# Patient Record
Sex: Male | Born: 1955 | Race: White | Hispanic: No | Marital: Married | Smoking: Never smoker
Health system: Southern US, Community
[De-identification: ages and names within clinical notes are randomized; demographics above are authoritative.]

## PROBLEM LIST (undated history)

## (undated) DIAGNOSIS — R161 Splenomegaly, not elsewhere classified: Secondary | ICD-10-CM

## (undated) DIAGNOSIS — D229 Melanocytic nevi, unspecified: Secondary | ICD-10-CM

## (undated) DIAGNOSIS — K7689 Other specified diseases of liver: Secondary | ICD-10-CM

## (undated) DIAGNOSIS — N400 Enlarged prostate without lower urinary tract symptoms: Secondary | ICD-10-CM

## (undated) DIAGNOSIS — R200 Anesthesia of skin: Secondary | ICD-10-CM

## (undated) DIAGNOSIS — R7989 Other specified abnormal findings of blood chemistry: Secondary | ICD-10-CM

## (undated) DIAGNOSIS — R17 Unspecified jaundice: Secondary | ICD-10-CM

## (undated) DIAGNOSIS — H938X9 Other specified disorders of ear, unspecified ear: Secondary | ICD-10-CM

## (undated) DIAGNOSIS — N281 Cyst of kidney, acquired: Secondary | ICD-10-CM

## (undated) DIAGNOSIS — R202 Paresthesia of skin: Secondary | ICD-10-CM

## (undated) DIAGNOSIS — M549 Dorsalgia, unspecified: Secondary | ICD-10-CM

## (undated) HISTORY — DX: Melanocytic nevi, unspecified: D22.9

## (undated) HISTORY — DX: Cyst of kidney, acquired: N28.1

## (undated) HISTORY — DX: Other specified disorders of ear, unspecified ear: H93.8X9

## (undated) HISTORY — PX: BACK SURGERY: SHX140

## (undated) HISTORY — DX: Splenomegaly, not elsewhere classified: R16.1

## (undated) HISTORY — DX: Anesthesia of skin: R20.0

## (undated) HISTORY — DX: Other specified abnormal findings of blood chemistry: R79.89

## (undated) HISTORY — DX: Unspecified jaundice: R17

## (undated) HISTORY — DX: Benign prostatic hyperplasia without lower urinary tract symptoms: N40.0

## (undated) HISTORY — DX: Other specified diseases of liver: K76.89

## (undated) HISTORY — DX: Dorsalgia, unspecified: M54.9

## (undated) HISTORY — DX: Anesthesia of skin: R20.2

---

## 2003-09-04 ENCOUNTER — Ambulatory Visit (HOSPITAL_COMMUNITY): Admission: RE | Admit: 2003-09-04 | Discharge: 2003-09-04 | Payer: Self-pay | Admitting: Specialist

## 2007-02-20 DIAGNOSIS — M549 Dorsalgia, unspecified: Secondary | ICD-10-CM

## 2007-02-20 HISTORY — DX: Dorsalgia, unspecified: M54.9

## 2007-02-20 HISTORY — PX: OTHER SURGICAL HISTORY: SHX169

## 2007-08-06 ENCOUNTER — Observation Stay (HOSPITAL_COMMUNITY): Admission: RE | Admit: 2007-08-06 | Discharge: 2007-08-07 | Payer: Self-pay | Admitting: Specialist

## 2009-02-02 ENCOUNTER — Ambulatory Visit: Payer: Self-pay | Admitting: Family Medicine

## 2009-02-02 DIAGNOSIS — IMO0002 Reserved for concepts with insufficient information to code with codable children: Secondary | ICD-10-CM | POA: Insufficient documentation

## 2009-02-02 DIAGNOSIS — D239 Other benign neoplasm of skin, unspecified: Secondary | ICD-10-CM | POA: Insufficient documentation

## 2009-02-02 LAB — CONVERTED CEMR LAB
ALT: 17 units/L (ref 0–53)
AST: 22 units/L (ref 0–37)
Albumin: 4.1 g/dL (ref 3.5–5.2)
Alkaline Phosphatase: 102 units/L (ref 39–117)
BUN: 11 mg/dL (ref 6–23)
Bilirubin, Direct: 0 mg/dL (ref 0.0–0.3)
CO2: 26 meq/L (ref 19–32)
Calcium: 9.4 mg/dL (ref 8.4–10.5)
Chloride: 101 meq/L (ref 96–112)
Cholesterol: 167 mg/dL (ref 0–200)
Creatinine, Ser: 0.9 mg/dL (ref 0.4–1.5)
GFR calc non Af Amer: 93.89 mL/min (ref >60)
Glucose, Bld: 86 mg/dL (ref 70–99)
HDL: 32 mg/dL — ABNORMAL LOW (ref >39.00)
LDL Cholesterol: 102 mg/dL — ABNORMAL HIGH (ref 0–99)
PSA: 2.96 ng/mL (ref 0.10–4.00)
Potassium: 4.2 meq/L (ref 3.5–5.1)
Sodium: 138 meq/L (ref 135–145)
Total Bilirubin: 1.1 mg/dL (ref 0.3–1.2)
Total CHOL/HDL Ratio: 5
Total Protein: 7.1 g/dL (ref 6.0–8.3)
Triglycerides: 165 mg/dL — ABNORMAL HIGH (ref 0.0–149.0)
VLDL: 33 mg/dL (ref 0.0–40.0)

## 2009-02-14 ENCOUNTER — Encounter: Payer: Self-pay | Admitting: Family Medicine

## 2009-02-28 ENCOUNTER — Encounter: Payer: Self-pay | Admitting: Family Medicine

## 2009-03-15 ENCOUNTER — Encounter: Payer: Self-pay | Admitting: Family Medicine

## 2009-05-25 ENCOUNTER — Encounter: Payer: Self-pay | Admitting: Family Medicine

## 2010-03-21 NOTE — Letter (Signed)
Summary: [facility]  [facility]   Imported By: Maryln Gottron 06/16/2009 15:51:41  _____________________________________________________________________  External Attachment:    Type:   Image     Comment:   External Document

## 2010-03-21 NOTE — Consult Note (Signed)
Summary: [facility]  [facility]   Imported By: Lanelle Bal 03/07/2009 10:23:07  _____________________________________________________________________  External Attachment:    Type:   Image     Comment:   External Document

## 2010-03-21 NOTE — Procedures (Signed)
Summary: Colonoscopy/[facility]  Colonoscopy/[facility]   Imported By: Lanelle Bal 06/14/2009 10:52:25  _____________________________________________________________________  External Attachment:    Type:   Image     Comment:   External Document  Appended Document: [facility], repeat colonoscopy in 5 years.

## 2010-03-21 NOTE — Consult Note (Signed)
Summary: [facility]  [facility]   Imported By: Lanelle Bal 03/25/2009 10:19:23  _____________________________________________________________________  External Attachment:    Type:   Image     Comment:   External Document

## 2010-07-04 NOTE — Op Note (Signed)
[name], [name]             ACCOUNT NO.:  [id number]   MEDICAL RECORD NO.:  [id number]          PATIENT TYPE:  AMB   LOCATION:  DAY                          FACILITY:  [facility]   PHYSICIAN:  Ronnie [name], M.D.    DATE OF BIRTH:  [birth year]   DATE OF PROCEDURE:  08/06/2007  DATE OF DISCHARGE:                               OPERATIVE REPORT   PREOPERATIVE DIAGNOSIS:  Spinal stenosis, herniated nucleus pulposus L5-  S1, right.   POSTOPERATIVE DIAGNOSIS:  Spinal stenosis, herniated nucleus pulposus L5-  S1, right.   PROCEDURE:  1. Lateral recess decompression.  2. Microdiskectomy L5-S.  3. Right foraminotomy S1.   SURGEON:  Ronnie [name], M.D.   ASSISTANT:  Ronnie [name], P.A.   ANESTHESIA:  General.   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  Positive neurotension signs,  diminished sensation of the S1 dermatome.  MRI indicating disk  herniation compressing the S1 nerve root and lateral recessed stenosis,  indicated for decompression.  This has been discussed including  bleeding, infection, damage to vascular structures,  CSF leakage  __________and future anesthetic complications etc.   The patient was placed in the supine position after induction of  adequate general anesthesia and had 1 g of Kefzol.  She was placed prone  on the [place] frame.  All bony prominences were well padded.  The  lumbar region was prepped in the usual sterile fashion.  An 18 gauge  spinal needle was utilized to localize the L5-S1 interspace confirmed  with x-ray.  An incision was made from the spinous process of 5 to S1.  The subcutaneous tissue was dissected by electrocautery to achieve  hemostasis.  The dorsolumbar fascia was identified by a longitudinal  skin incision and the paraspinous muscle elevated from lamina of  5 to  1.  McCullough retractors were placed. The operating microscope was  draped and brought in the  surgical field after a Penfield 4 was placed  in the interlaminar space confirming the space with x-ray.  Hemilaminotomy at the caudad edge of 5-S1 with a 2-mm Kerrison removing  the ligamentum flavum attachment, detaching the cephalad edge of S1  utilizing a straight curette,  removed from the interspace.  Neural  elements well protected with a neural patty.  Severe lateral recess  stenosis was noted compressing the S1 nerve root into the lateral  recess.  I performed a decompression of the lateral recess to the medial  border of the pedicle utilizing a 2-mm Kerrison.  There was extensive  fibrotic tissue tethering the S1 nerve root laterally.  This was lysed  to mobilize the S1 nerve root.  This was then gently mobilized medially  protecting the thecal sac and the lateral at the edge of the nerve root.  There was focal HNP.  This was confirmed with an 18 gauge needle.  I  then incised the annulus with a 15 blade.  I performed diskectomy and  removed first copious portions of disk material with a straight upbiting  pituitary through the disk space.  I then used an Epstein to further  mobilize this caudad as the fragment was noted to migrate caudad from  the disk space.  Multiple fragments were retrieved with a nerve hook and  the pituitary.  Following the diskectomy of all herniated material,  there was good excursion of the S1 nerve release 1-cm medial to the  pedicle.  A foraminotomy of S1 was performed as well.  The hockey stick  was placed freely up the foramen of 5 and S1.  The disk space was  copiously irrigated with antibiotic irrigation.  Inspection revealed no  CSF leaks or active bleeding.  There was  no disk herniation of the  axilla root beneath the thecal sac, shoulder of the root, or of either  foramen.  We then placed thrombin-soaked Gelfoam into the laminar space  and removed the McCullough retractor.  The paraspinous muscle inspected  with no active bleeding. The  dorsolumbar fascia was reapproximated with  1Vicryl interrupted figure-of-eight suture.  The subcutaneous tissue was  reapproximated with 2-0 Vicryl subcuticular sutures.  The skin was  reapproximated with subcuticular Prolene and the wound was reinforced  with Steri-Strips.  Sterile dressing applied.  The patient was placed  supine on the hospital bed, extubated without difficulty and transported  to recovery room in satisfactory addition.  The patient tolerated the  procedure with no complications.      Ronnie [name], M.D.  Electronically Signed     JB/MEDQ  D:  08/06/2007  T:  08/06/2007  Job:  [id number]

## 2010-11-16 LAB — URINALYSIS, ROUTINE W REFLEX MICROSCOPIC
Bilirubin Urine: NEGATIVE
Glucose, UA: NEGATIVE
Hgb urine dipstick: NEGATIVE
Ketones, ur: NEGATIVE
Nitrite: NEGATIVE
Protein, ur: NEGATIVE
Specific Gravity, Urine: 1.019
Urobilinogen, UA: 1
pH: 7

## 2010-11-16 LAB — COMPREHENSIVE METABOLIC PANEL
ALT: 17
AST: 20
Albumin: 3.7
Alkaline Phosphatase: 103
BUN: 8
CO2: 25
Calcium: 9
Chloride: 107
Creatinine, Ser: 0.88
GFR calc Af Amer: >60
GFR calc non Af Amer: >60
Glucose, Bld: 98
Potassium: 3.8
Sodium: 139
Total Bilirubin: 1.5 — ABNORMAL HIGH
Total Protein: 6.3

## 2010-11-16 LAB — PROTIME-INR
INR: 1
Prothrombin Time: 13.4

## 2010-11-16 LAB — CBC
HCT: 45.1
Hemoglobin: 15.7
MCHC: 34.7
MCV: 85.4
Platelets: 215
RBC: 5.28
RDW: 13.4
WBC: 6.2

## 2010-11-16 LAB — APTT: aPTT: 29

## 2013-05-28 ENCOUNTER — Encounter: Payer: Self-pay | Admitting: Family Medicine

## 2013-05-28 ENCOUNTER — Ambulatory Visit (INDEPENDENT_AMBULATORY_CARE_PROVIDER_SITE_OTHER): Payer: Managed Care, Other (non HMO) | Admitting: Family Medicine

## 2013-05-28 VITALS — BP 110/72 | HR 98 | Temp 99.5°F | Wt 197.5 lb

## 2013-05-28 DIAGNOSIS — J069 Acute upper respiratory infection, unspecified: Secondary | ICD-10-CM | POA: Insufficient documentation

## 2013-05-28 DIAGNOSIS — B9789 Other viral agents as the cause of diseases classified elsewhere: Principal | ICD-10-CM

## 2013-05-28 NOTE — Progress Notes (Signed)
Pre visit review using our clinic review tool, if applicable. No additional management support is needed unless otherwise documented below in the visit note. 

## 2013-05-28 NOTE — Assessment & Plan Note (Signed)
Supportive care as per instructions. Update if sxs persist or fail to improve. Red flags to return discussed. Declines prescription cough syrup today.

## 2013-05-28 NOTE — Patient Instructions (Addendum)
Sounds like you have a viral respiratory infection. Antibiotics are not needed for this.  Viral infections usually take 7-10 days to resolve.  The cough can last a few weeks to go away. Ibuprofen 400-600mg  twice daily with food.  Start plain mucinex or immediate release guaifenesin with plenty of water to help mobilize mucous. Push fluids and plenty of rest. Let me know if fever >101 after first few days, or worsening productive cough despite above, or prolonged symptoms past 7/10 days. Call clinic with questions.  Good to see you today.

## 2013-05-28 NOTE — Progress Notes (Signed)
   BP 110/72  Pulse 98  Temp(Src) 99.5 F (37.5 C) (Oral)  Wt 197 lb 8 oz (89.585 kg)  SpO2 96%   CC: cough with chills  Subjective:    Patient ID: [name], male    DOB: 08/26/1955, 58 y.o.   MRN: [id number]  HPI: Ademide Schaberg Ganci is a 58 y.o. male presenting on 05/28/2013 for Chills, cough   Patient of Dr. Hulen Shouts not seen here since 2010 presents today with 2d h/o cough mildly productive of mucous, fever to 100.5, progressively getting worse.  Increasing fatigue recently.  Slept ok last night.  No head congestion, sinus pain, ST, nausea/vomiting, diarrhea, headache, ear or tooth pain, PNDrainage.  Hasn't tried anything for this so far.  No h/o asthma, allergies. No sick contacts at home. No smokers at home. Did not get flu shot this year.  Relevant past medical, surgical, family and social history reviewed and updated as indicated.  Allergies and medications reviewed and updated. No current outpatient prescriptions on file prior to visit.   No current facility-administered medications on file prior to visit.    Review of Systems Per HPI unless specifically indicated above    Objective:    BP 110/72  Pulse 98  Temp(Src) 99.5 F (37.5 C) (Oral)  Wt 197 lb 8 oz (89.585 kg)  SpO2 96%  Physical Exam  Nursing note and vitals reviewed. Constitutional: He appears well-developed and well-nourished. No distress.  HENT:  Head: Normocephalic and atraumatic.  Right Ear: Hearing, tympanic membrane, external ear and ear canal normal.  Left Ear: Hearing, tympanic membrane, external ear and ear canal normal.  Nose: Nose normal. No mucosal edema or rhinorrhea. Right sinus exhibits no maxillary sinus tenderness and no frontal sinus tenderness. Left sinus exhibits no maxillary sinus tenderness and no frontal sinus tenderness.  Mouth/Throat: Uvula is midline, oropharynx is clear and moist and mucous membranes are normal. No oropharyngeal exudate, posterior oropharyngeal  edema, posterior oropharyngeal erythema or tonsillar abscesses.  Eyes: Conjunctivae and EOM are normal. Pupils are equal, round, and reactive to light. No scleral icterus.  Neck: Normal range of motion. Neck supple. No thyromegaly present.  Cardiovascular: Normal rate, regular rhythm, normal heart sounds and intact distal pulses.   No murmur heard. Pulmonary/Chest: Effort normal and breath sounds normal. No respiratory distress. He has no wheezes. He has no rales.  Lymphadenopathy:    He has no cervical adenopathy.  Skin: Skin is warm and dry. No rash noted.       Assessment & Plan:   Problem List Items Addressed This Visit   Viral URI with cough - Primary     Supportive care as per instructions. Update if sxs persist or fail to improve. Red flags to return discussed. Declines prescription cough syrup today.        Follow up plan: Return if symptoms worsen or fail to improve.

## 2015-02-20 HISTORY — PX: COLONOSCOPY: SHX174

## 2015-10-18 ENCOUNTER — Ambulatory Visit (INDEPENDENT_AMBULATORY_CARE_PROVIDER_SITE_OTHER): Payer: 59 | Admitting: Family Medicine

## 2015-10-18 ENCOUNTER — Ambulatory Visit (INDEPENDENT_AMBULATORY_CARE_PROVIDER_SITE_OTHER)
Admission: RE | Admit: 2015-10-18 | Discharge: 2015-10-18 | Disposition: A | Discharge status: Home / Self Care | Payer: 59 | Source: Ambulatory Visit | Attending: Family Medicine | Admitting: Family Medicine

## 2015-10-18 ENCOUNTER — Encounter: Payer: Self-pay | Admitting: Family Medicine

## 2015-10-18 VITALS — BP 120/64 | HR 71 | Temp 98.7°F | Wt 197.0 lb

## 2015-10-18 DIAGNOSIS — R0789 Other chest pain: Secondary | ICD-10-CM

## 2015-10-18 MED ORDER — IBUPROFEN 200 MG PO TABS: 400.0000 mg | ORAL_TABLET | Freq: Three times a day (TID) | ORAL | Status: DC | PRN

## 2015-10-18 NOTE — Progress Notes (Signed)
Sx started about 1 month ago.  R sided chest pain.  Not sore to the touch.  Noted when laying in bed and moving around.  No L sided pain except for just L of the sternum, recently occ noted, but not as severe as the R side.  Gradually the painful area to R of sternum got larger.  He felt a tearing sensation locally on R side later on, not initially.   Sx also noted with twisting to the left side.    No trauma initially.  No bruising.  No meds or intervention tried.  Pensions consultant at [employer].  No heavy lifting.  No back pain.  No FCNAVD.  He doesn't fell unwell o/w.  Normal strength in the ext per patient report.   Meds, vitals, and allergies reviewed.   ROS: Per HPI unless specifically indicated in ROS section   GEN: nad, alert and oriented HEENT: mucous membranes moist, OP wnl NECK: supple w/o LA CV: rrr.  PULM: ctab, no inc wob, no UAN noise EXT: no edema SKIN: no acute rash R chest wall ttp just lateral to mid portion of the sternum, reproduced with deep breath. No bruising.  Is ttp locally.

## 2015-10-18 NOTE — Progress Notes (Signed)
Pre visit review using our clinic review tool, if applicable. No additional management support is needed unless otherwise documented below in the visit note. 

## 2015-10-18 NOTE — Patient Instructions (Signed)
Go to the lab on the way out.  We'll contact you with your xray report. Try taking 400mg  ibuprofen up to 3 times a day with food.  Update [place] if not better.  Take care.  Glad to see you.

## 2015-10-19 DIAGNOSIS — R0789 Other chest pain: Secondary | ICD-10-CM | POA: Insufficient documentation

## 2015-10-19 NOTE — Assessment & Plan Note (Signed)
He has reproducible symptoms. I'm not concerned for any intrathoracic issue to be causing his current symptoms. There's no sign of an ominous diagnosis. See after visit summary. We can check a chest x-ray in the meantime. See notes on that report. Follow-up when necessary. Routed to PCP as FYI.

## 2016-01-05 ENCOUNTER — Encounter: Payer: Self-pay | Admitting: Family Medicine

## 2016-05-31 ENCOUNTER — Ambulatory Visit (INDEPENDENT_AMBULATORY_CARE_PROVIDER_SITE_OTHER): Payer: 59 | Admitting: Family Medicine

## 2016-05-31 ENCOUNTER — Encounter: Payer: Self-pay | Admitting: Family Medicine

## 2016-05-31 VITALS — BP 120/62 | HR 68 | Temp 98.5°F | Ht 70.5 in | Wt 198.2 lb

## 2016-05-31 DIAGNOSIS — R350 Frequency of micturition: Secondary | ICD-10-CM

## 2016-05-31 DIAGNOSIS — N451 Epididymitis: Secondary | ICD-10-CM

## 2016-05-31 LAB — POC URINALSYSI DIPSTICK (AUTOMATED)
Bilirubin, UA: NEGATIVE
Blood, UA: NEGATIVE
Glucose, UA: NEGATIVE
Ketones, UA: NEGATIVE
Leukocytes, UA: NEGATIVE
Nitrite, UA: NEGATIVE
Protein, UA: NEGATIVE
Spec Grav, UA: >=1.03 — AB (ref 1.010–1.025)
Urobilinogen, UA: 0.2 E.U./dL
pH, UA: 6 (ref 5.0–8.0)

## 2016-05-31 MED ORDER — LEVOFLOXACIN 500 MG PO TABS: 500.0000 mg | ORAL_TABLET | Freq: Every day | ORAL | 0 refills | Status: DC

## 2016-05-31 NOTE — Addendum Note (Signed)
Addended by: Carter Kitten on: 05/31/2016 10:56 AM   Modules accepted: Orders

## 2016-05-31 NOTE — Progress Notes (Signed)
Dr. Frederico Hamman T. Gloristine Turrubiates, MD, [facility] [place], [address] Phone: [phone] Fax: [phone]  05/31/2016  Patient: Ronnie [name], MRN: [id number], DOB: December 05, 1955, 61 y.o.  Primary Physician:  Arnette Norris, MD   Chief Complaint  Patient presents with  . Urinary Frequency   Subjective:   Ronnie [name] is a 61 y.o. very pleasant male patient who presents with the following:  ? UTI. Had some swelling in the buttocks and  Nothing topically No swelling or tenderness.  Some pain in the back of the R testicle  Past Medical History, Surgical History, Social History, Family History, Problem List, Medications, and Allergies have been reviewed and updated if relevant.  Patient Active Problem List   Diagnosis Date Noted  . Chest wall pain 10/19/2015  . Viral URI with cough 05/28/2013  . NEVI, MULTIPLE 02/02/2009  . BACK PAIN WITH RADICULOPATHY 02/02/2009    Past Medical History:  Diagnosis Date  . Back pain 2009   with radiculopathy. Fell off roof bulging L4/L5 and a comp fx L5/S1  . Ear lump   . Numbness and tingling of foot    Right  . 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 Bulging disc L4/L5, comp fx L5/S1  2009   Post falling off roof    Social History   Social History  . Marital status: Married    Spouse name: N/A  . Number of children: 1  . Years of education: N/A   Occupational History  . Art gallery manager    Social History Main Topics  . Smoking status: Never Smoker  . Smokeless tobacco: Never Used  . Alcohol use No  . Drug use: No  . Sexual activity: Not on file   Other Topics Concern  . Not on file   Social History Narrative   No regular exercise.    Family History  Problem Relation Age of Onset  . Cancer Mother     Liver  . Heart disease Father     rheumatic heart disease, no CAD    No Known  Allergies  Medication list reviewed and updated in full in [place].  ROS: GEN: Acute illness details above GI: Tolerating PO intake GU: maintaining adequate hydration and urination Pulm: No SOB Interactive and getting along well at home.  Otherwise, ROS is as per the HPI.  Objective:   BP 120/62   Pulse 68   Temp 98.5 F (36.9 C) (Oral)   Ht 5' 10.5" (1.791 m)   Wt 198 lb 4 oz (89.9 kg)   BMI 28.04 kg/m    GEN: WDWN, NAD, Non-toxic, Alert & Oriented x 3 HEENT: Atraumatic, Normocephalic.  Ears and Nose: No external deformity. EXTR: No clubbing/cyanosis/edema NEURO: Normal gait.  PSYCH: Normally interactive. Conversant. Not depressed or anxious appearing.  Calm demeanor.   GU: mild tenderness on posterior of r testicle   Laboratory and Imaging Data: Results for orders placed or performed in visit on 05/31/16  POCT Urinalysis Dipstick (Automated)  Result Value Ref Range   Color, UA yellow    Clarity, UA clear    Glucose, UA negative    Bilirubin, UA negative    Ketones, UA negative    Spec Grav, UA >=1.030 (A) 1.010 - 1.025   Blood, UA negative    pH, UA 6.0 5.0 - 8.0  Protein, UA negative    Urobilinogen, UA 0.2 0.2 or 1.0 E.U./dL   Nitrite, UA negative    Leukocytes, UA Negative Negative     Assessment and Plan:   Epididymitis  Urinary frequency - Plan: POCT Urinalysis Dipstick (Automated)  Probable epididymitis - check culture also given sx  Follow-up: No Follow-up on file.  Meds ordered this encounter  Medications  . levofloxacin (LEVAQUIN) 500 MG tablet    Sig: Take 1 tablet (500 mg total) by mouth daily.    Dispense:  10 tablet    Refill:  0   Medications Discontinued During This Encounter  Medication Reason  . ibuprofen (MOTRIN IB) 200 MG tablet No longer needed (for PRN medications)   Orders Placed This Encounter  Procedures  . POCT Urinalysis Dipstick (Automated)    Signed,  Tekeshia Klahr T. Sakoya Win, MD   Allergies as of  05/31/2016   No Known Allergies     Medication List       Accurate as of 05/31/16 10:50 AM. Always use your most recent med list.          levofloxacin 500 MG tablet Commonly known as:  LEVAQUIN Take 1 tablet (500 mg total) by mouth daily.

## 2016-05-31 NOTE — Progress Notes (Signed)
Pre visit review using our clinic review tool, if applicable. No additional management support is needed unless otherwise documented below in the visit note. 

## 2016-06-02 LAB — URINE CULTURE: Organism ID, Bacteria: NO GROWTH

## 2016-06-10 ENCOUNTER — Emergency Department (HOSPITAL_COMMUNITY): Payer: 59

## 2016-06-10 ENCOUNTER — Encounter (HOSPITAL_COMMUNITY): Payer: Self-pay | Admitting: Emergency Medicine

## 2016-06-10 ENCOUNTER — Emergency Department (HOSPITAL_COMMUNITY)
Admission: EM | Admit: 2016-06-10 | Discharge: 2016-06-10 | Disposition: A | Discharge status: Home / Self Care | Payer: 59 | Attending: Emergency Medicine | Admitting: Emergency Medicine

## 2016-06-10 DIAGNOSIS — N202 Calculus of kidney with calculus of ureter: Secondary | ICD-10-CM | POA: Diagnosis not present

## 2016-06-10 DIAGNOSIS — Z79899 Other long term (current) drug therapy: Secondary | ICD-10-CM | POA: Insufficient documentation

## 2016-06-10 DIAGNOSIS — R109 Unspecified abdominal pain: Secondary | ICD-10-CM

## 2016-06-10 DIAGNOSIS — R1031 Right lower quadrant pain: Secondary | ICD-10-CM | POA: Diagnosis present

## 2016-06-10 DIAGNOSIS — N200 Calculus of kidney: Secondary | ICD-10-CM

## 2016-06-10 LAB — COMPREHENSIVE METABOLIC PANEL
ALT: 18 U/L (ref 17–63)
AST: 32 U/L (ref 15–41)
Albumin: 4.2 g/dL (ref 3.5–5.0)
Alkaline Phosphatase: 86 U/L (ref 38–126)
Anion gap: 13 (ref 5–15)
BUN: 15 mg/dL (ref 6–20)
CO2: 23 mmol/L (ref 22–32)
Calcium: 9.4 mg/dL (ref 8.9–10.3)
Chloride: 103 mmol/L (ref 101–111)
Creatinine, Ser: 1.23 mg/dL (ref 0.61–1.24)
GFR calc Af Amer: >60 mL/min (ref >60)
GFR calc non Af Amer: >60 mL/min (ref >60)
Glucose, Bld: 130 mg/dL — ABNORMAL HIGH (ref 65–99)
Potassium: 3.7 mmol/L (ref 3.5–5.1)
Sodium: 139 mmol/L (ref 135–145)
Total Bilirubin: 1.2 mg/dL (ref 0.3–1.2)
Total Protein: 6.4 g/dL — ABNORMAL LOW (ref 6.5–8.1)

## 2016-06-10 LAB — URINALYSIS, ROUTINE W REFLEX MICROSCOPIC
Bacteria, UA: NONE SEEN
Bilirubin Urine: NEGATIVE
Glucose, UA: NEGATIVE mg/dL
Ketones, ur: 5 mg/dL — AB
Leukocytes, UA: NEGATIVE
Nitrite: NEGATIVE
Protein, ur: 100 mg/dL — AB
Specific Gravity, Urine: 1.019 (ref 1.005–1.030)
pH: 7 (ref 5.0–8.0)

## 2016-06-10 LAB — LIPASE, BLOOD: Lipase: 18 U/L (ref 11–51)

## 2016-06-10 LAB — I-STAT CG4 LACTIC ACID, ED: Lactic Acid, Venous: 2.8 mmol/L (ref 0.5–1.9)

## 2016-06-10 LAB — CBC
HCT: 43.9 % (ref 39.0–52.0)
Hemoglobin: 14.7 g/dL (ref 13.0–17.0)
MCH: 28.5 pg (ref 26.0–34.0)
MCHC: 33.5 g/dL (ref 30.0–36.0)
MCV: 85.2 fL (ref 78.0–100.0)
Platelets: 227 10*3/uL (ref 150–400)
RBC: 5.15 MIL/uL (ref 4.22–5.81)
RDW: 13.3 % (ref 11.5–15.5)
WBC: 7.4 10*3/uL (ref 4.0–10.5)

## 2016-06-10 MED ORDER — HYDROCODONE-ACETAMINOPHEN 5-325 MG PO TABS: 1.0000 | ORAL_TABLET | ORAL | 0 refills | Status: DC | PRN

## 2016-06-10 MED ORDER — ONDANSETRON HCL 4 MG/2ML IJ SOLN
4.0000 mg | Freq: Once | INTRAMUSCULAR | Status: AC
Start: 2016-06-10 — End: 2016-06-10
  Administered 2016-06-10: 4 mg via INTRAVENOUS
  Filled 2016-06-10: qty 2

## 2016-06-10 MED ORDER — SODIUM CHLORIDE 0.9 % IV BOLUS (SEPSIS)
1000.0000 mL | Freq: Once | INTRAVENOUS | Status: AC
  Administered 2016-06-10: 1000 mL via INTRAVENOUS

## 2016-06-10 MED ORDER — HYDROMORPHONE HCL 1 MG/ML IJ SOLN
1.0000 mg | Freq: Once | INTRAMUSCULAR | Status: AC
  Administered 2016-06-10: 1 mg via INTRAVENOUS
  Filled 2016-06-10: qty 1

## 2016-06-10 NOTE — Discharge Instructions (Signed)
Follow-up with urology if you do not pass her stone in the next 24 hours. Return to the ER if you develop uncontrolled pain, fevers or worsening or new symptoms. For severe pain take norco or vicodin however realize they have the potential for addiction and it can make you sleepy and has tylenol in it.  No operating machinery while taking. If you were given medicines take as directed.  If you are on coumadin or contraceptives realize their levels and effectiveness is altered by many different medicines.  If you have any reaction (rash, tongues swelling, other) to the medicines stop taking and see a physician.    If your blood pressure was elevated in the ER make sure you follow up for management with a primary doctor or return for chest pain, shortness of breath or stroke symptoms.  Please follow up as directed and return to the ER or see a physician for new or worsening symptoms.  Thank you. Vitals:   06/10/16 2149 06/10/16 2200 06/10/16 2230 06/10/16 2245  BP: (!) 150/84 (!) 143/72 (!) 146/77 131/79  Pulse: 62 61 63 65  Resp: 18 (!) 26    Temp: 98.7 F (37.1 C)     TempSrc: Oral     SpO2: 99% 99% 96% 96%  Weight:      Height:

## 2016-06-10 NOTE — ED Triage Notes (Addendum)
Pt. reports worsening low abdominal pain radiating to right flank onset yesterday , mild nausea , no emesis or diarrhea. Pt. denies hematuria or dysuria . No fever or chills . Pt. added bladder pressure and urinary frequency with scant urine output.

## 2016-06-10 NOTE — ED Provider Notes (Signed)
[facility] DEPT Provider Note   CSN: [id number] Arrival date & time: 06/10/16  2140     History   Chief Complaint Chief Complaint  Patient presents with  . Abdominal Pain  . Flank Pain    HPI Ronnie [name] is a 61 y.o. male.  The history is provided by the patient.  Abdominal Pain   This is a new problem. The current episode started more than 2 days ago. The problem occurs constantly. The problem has been gradually worsening. Associated with: No recent sick contacts or recent travel. The pain is located in the RLQ. The quality of the pain is aching, sharp and shooting. The pain is at a severity of 10/10. The pain is severe. Associated symptoms include anorexia, nausea and frequency. Pertinent negatives include fever, belching, diarrhea, flatus, hematochezia, melena, vomiting, constipation, dysuria, hematuria, headaches, arthralgias and myalgias. The symptoms are aggravated by urination. Nothing relieves the symptoms. Past workup does not include surgery. His past medical history does not include PUD.    Past Medical History:  Diagnosis Date  . Back pain 2009   with radiculopathy. Fell off roof bulging L4/L5 and a comp fx L5/S1  . Ear lump   . Numbness and tingling of foot    Right  . Numerous moles    Has always had multiple nevi    Patient Active Problem List   Diagnosis Date Noted  . Chest wall pain 10/19/2015  . NEVI, MULTIPLE 02/02/2009  . BACK PAIN WITH RADICULOPATHY 02/02/2009    Past Surgical History:  Procedure Laterality Date  . BACK SURGERY    . Bulging disc L4/L5, comp fx L5/S1  2009   Post falling off roof       Home Medications    Prior to Admission medications   Medication Sig Start Date End Date Taking? Authorizing Provider  HYDROcodone-acetaminophen (NORCO) 5-325 MG tablet Take 1-2 tablets by mouth every 4 (four) hours as needed. 06/10/16   Elnora Morrison, MD  levofloxacin (LEVAQUIN) 500 MG tablet Take 1 tablet (500 mg total) by mouth  daily. 05/31/16   Owens Loffler, MD    Family History Family History  Problem Relation Age of Onset  . Cancer Mother     Liver  . Heart disease Father     rheumatic heart disease, no CAD    Social History Social History  Substance Use Topics  . Smoking status: Never Smoker  . Smokeless tobacco: Never Used  . Alcohol use No     Allergies   Patient has no known allergies.   Review of Systems Review of Systems  Constitutional: Negative for chills and fever.  HENT: Negative for ear pain and sore throat.   Eyes: Negative for pain and visual disturbance.  Respiratory: Negative for cough and shortness of breath.   Cardiovascular: Negative for chest pain and palpitations.  Gastrointestinal: Positive for abdominal pain, anorexia and nausea. Negative for constipation, diarrhea, flatus, hematochezia, melena and vomiting.  Genitourinary: Positive for difficulty urinating and frequency. Negative for dysuria and hematuria.  Musculoskeletal: Negative for arthralgias, back pain and myalgias.  Skin: Negative for color change and rash.  Neurological: Negative for seizures, syncope and headaches.  All other systems reviewed and are negative.    Physical Exam Updated Vital Signs BP 119/68   Pulse 87   Temp 98.7 F (37.1 C) (Oral)   Resp (!) 26   Ht 5' 10.5" (1.791 m)   Wt 87.5 kg   SpO2 92%   BMI 27.30  kg/m   Physical Exam  Constitutional: He appears well-developed and well-nourished. He appears distressed.  HENT:  Head: Normocephalic and atraumatic.  Eyes: Conjunctivae and EOM are normal.  Neck: Neck supple.  Cardiovascular: Normal rate and regular rhythm.   No murmur heard. Pulmonary/Chest: Effort normal and breath sounds normal. No respiratory distress.  Abdominal: Soft. There is tenderness in the right lower quadrant. There is CVA tenderness (right sided). There is no tenderness at McBurney's point.  Musculoskeletal: He exhibits no edema.  Neurological: He is alert.    Skin: Skin is warm and dry.  Psychiatric: He has a normal mood and affect.  Nursing note and vitals reviewed.    ED Treatments / Results  Labs (all labs ordered are listed, but only abnormal results are displayed) Labs Reviewed  COMPREHENSIVE METABOLIC PANEL - Abnormal; Notable for the following:       Result Value   Glucose, Bld 130 (*)    Total Protein 6.4 (*)    All other components within normal limits  URINALYSIS, ROUTINE W REFLEX MICROSCOPIC - Abnormal; Notable for the following:    APPearance HAZY (*)    Hgb urine dipstick SMALL (*)    Ketones, ur 5 (*)    Protein, ur 100 (*)    Squamous Epithelial / LPF 0-5 (*)    All other components within normal limits  I-STAT CG4 LACTIC ACID, ED - Abnormal; Notable for the following:    Lactic Acid, Venous 2.80 (*)    All other components within normal limits  LIPASE, BLOOD  CBC    EKG  EKG Interpretation None       Radiology Ct Renal Stone Study  Result Date: 06/10/2016 CLINICAL DATA:  Lower abdominal pain radiating to the right flank onset yesterday. EXAM: CT ABDOMEN AND PELVIS WITHOUT CONTRAST TECHNIQUE: Multidetector CT imaging of the abdomen and pelvis was performed following the standard protocol without IV contrast. COMPARISON:  None. FINDINGS: Lower chest: No acute abnormality. Minimal dependent atelectasis bilaterally. Hepatobiliary: No focal liver abnormality is seen. No gallstones, gallbladder wall thickening, or biliary dilatation. Pancreas: Unremarkable. No pancreatic ductal dilatation or surrounding inflammatory changes. Spleen: Normal in size without focal abnormality. Adrenals/Urinary Tract: 5 mm calculus is noted almost having passed through the interstitial bladder portion of the right ureter. This is causing moderate right-sided hydroureteronephrosis. The left kidney and ureter is unremarkable. The bladder is nondistended. Stomach/Bowel: Moderate fluid-filled distention of the stomach. Normal small bowel  rotation without obstruction or inflammation. Diffuse colonic spasm noted with scattered areas of colonic diverticulosis. No acute diverticulitis. No bowel obstruction. Vascular/Lymphatic: Aortic atherosclerosis. No enlarged abdominal or pelvic lymph nodes. Reproductive: Prostate is mildly enlarged up to 5.7 cm with central zone calcifications. Unremarkable seminal vesicles. Other: Small fat containing umbilical hernia. No abdominopelvic ascites. Musculoskeletal: Degenerative disc disease L4-5 and L5-S1. No acute nor suspicious osseous abnormalities. IMPRESSION: 1. Moderate right-sided hydroureteronephrosis secondary to a 5 mm distal right ureteral calculus which has almost passed completely into the bladder. 2. Mild prostatic enlargement up to 5.7 cm. Electronically Signed   By: Ashley Royalty M.D.   On: 06/10/2016 22:26    Procedures Procedures (including critical care time)  Medications Ordered in ED Medications  sodium chloride 0.9 % bolus 1,000 mL (0 mLs Intravenous Stopped 06/10/16 2252)  ondansetron (ZOFRAN) injection 4 mg (4 mg Intravenous Given 06/10/16 2211)  HYDROmorphone (DILAUDID) injection 1 mg (1 mg Intravenous Given 06/10/16 2211)     Initial Impression / Assessment and Plan / ED Course  I have reviewed the triage vital signs and the nursing notes.  Pertinent labs & imaging results that were available during my care of the patient were reviewed by me and considered in my medical decision making (see chart for details).     61 year old male with no pertinent past medical history presents in setting of right lower quadrant abdominal pain, right flank pain, decreased urination. Patient reports symptoms of been intermittent over last several days but significantly worsened today. On arrival patient distressed with right flank pain without significant tenderness to palpation. No abnormalities and generally ureter exam. CT scan revealed 5 mm kidney stone. Patient without significant  elevation in creatinine nor signs of urinary tract infection. Patient given IV fluids and pain medication on reassessment had almost complete resolution of symptoms.  As stone is at the UVJ without significant hydronephrosis or signs of kidney dysfunction believe patient is safe for discharge home with oral pain medication, planned increased hydration and follow-up with urology for further management of this condition. Additionally no signs of urinary tract infection noted. Patient was stable at time of discharge and strict return precautions given. Patient in agreement with plan.  Final Clinical Impressions(s) / ED Diagnoses   Final diagnoses:  Right flank discomfort  Nephrolithiasis    New Prescriptions Discharge Medication List as of 06/10/2016 11:20 PM    START taking these medications   Details  HYDROcodone-acetaminophen (NORCO) 5-325 MG tablet Take 1-2 tablets by mouth every 4 (four) hours as needed., Starting Sun 06/10/2016, Print         Esaw Grandchild, MD 06/10/16 [place], MD 06/10/16 2354

## 2016-06-10 NOTE — ED Notes (Signed)
Patient transported to CT SCAN . 

## 2016-06-10 NOTE — ED Notes (Signed)
Pt and family understood dc material. NAD noted. Script given at dc 

## 2018-06-25 ENCOUNTER — Ambulatory Visit (INDEPENDENT_AMBULATORY_CARE_PROVIDER_SITE_OTHER): Payer: 59 | Admitting: Internal Medicine

## 2018-06-25 ENCOUNTER — Encounter: Payer: Self-pay | Admitting: Internal Medicine

## 2018-06-25 VITALS — BP 120/70

## 2018-06-25 DIAGNOSIS — H9313 Tinnitus, bilateral: Secondary | ICD-10-CM

## 2018-06-25 DIAGNOSIS — H9319 Tinnitus, unspecified ear: Secondary | ICD-10-CM | POA: Insufficient documentation

## 2018-06-25 DIAGNOSIS — S139XXA Sprain of joints and ligaments of unspecified parts of neck, initial encounter: Secondary | ICD-10-CM
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
## 2018-06-25 NOTE — Assessment & Plan Note (Signed)
Mild and recent I discussed that I don't think this is related to his neck If persists, or clear hearing changes, recommended he call [facility] for a check up
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
## 2018-09-01 ENCOUNTER — Ambulatory Visit (INDEPENDENT_AMBULATORY_CARE_PROVIDER_SITE_OTHER): Payer: PRIVATE HEALTH INSURANCE | Admitting: Internal Medicine

## 2018-09-01 ENCOUNTER — Encounter: Payer: Self-pay | Admitting: Internal Medicine

## 2018-09-01 ENCOUNTER — Other Ambulatory Visit: Payer: Self-pay

## 2018-09-01 VITALS — BP 110/70 | HR 64 | Temp 98.1°F | Ht 70.5 in | Wt 197.0 lb

## 2018-09-01 DIAGNOSIS — Z125 Encounter for screening for malignant neoplasm of prostate: Secondary | ICD-10-CM | POA: Diagnosis not present

## 2018-09-01 DIAGNOSIS — Z0000 Encounter for general adult medical examination without abnormal findings: Secondary | ICD-10-CM | POA: Diagnosis not present

## 2018-09-01 DIAGNOSIS — Z23 Encounter for immunization: Secondary | ICD-10-CM

## 2018-09-01 DIAGNOSIS — R200 Anesthesia of skin: Secondary | ICD-10-CM | POA: Insufficient documentation

## 2018-09-01 LAB — CBC
HCT: 45.1 % (ref 39.0–52.0)
Hemoglobin: 15.1 g/dL (ref 13.0–17.0)
MCHC: 33.5 g/dL (ref 30.0–36.0)
MCV: 89 fl (ref 78.0–100.0)
Platelets: 257 10*3/uL (ref 150.0–400.0)
RBC: 5.07 Mil/uL (ref 4.22–5.81)
RDW: 13.6 % (ref 11.5–15.5)
WBC: 7.4 10*3/uL (ref 4.0–10.5)

## 2018-09-01 LAB — COMPREHENSIVE METABOLIC PANEL
ALT: 13 U/L (ref 0–53)
AST: 16 U/L (ref 0–37)
Albumin: 4.2 g/dL (ref 3.5–5.2)
Alkaline Phosphatase: 100 U/L (ref 39–117)
BUN: 10 mg/dL (ref 6–23)
CO2: 28 mEq/L (ref 19–32)
Calcium: 8.7 mg/dL (ref 8.4–10.5)
Chloride: 104 mEq/L (ref 96–112)
Creatinine, Ser: 0.95 mg/dL (ref 0.40–1.50)
GFR: 80.23 mL/min (ref >60.00)
Glucose, Bld: 90 mg/dL (ref 70–99)
Potassium: 4.3 mEq/L (ref 3.5–5.1)
Sodium: 138 mEq/L (ref 135–145)
Total Bilirubin: 1.3 mg/dL — ABNORMAL HIGH (ref 0.2–1.2)
Total Protein: 6.4 g/dL (ref 6.0–8.3)

## 2018-09-01 LAB — LIPID PANEL
Cholesterol: 179 mg/dL (ref 0–200)
HDL: 35 mg/dL — ABNORMAL LOW (ref >39.00)
LDL Cholesterol: 116 mg/dL — ABNORMAL HIGH (ref 0–99)
NonHDL: 143.56
Total CHOL/HDL Ratio: 5
Triglycerides: 140 mg/dL (ref 0.0–149.0)
VLDL: 28 mg/dL (ref 0.0–40.0)

## 2018-09-01 LAB — PSA: PSA: 6.83 ng/mL — ABNORMAL HIGH (ref 0.10–4.00)

## 2018-09-01 LAB — VITAMIN B12: Vitamin B-12: 350 pg/mL (ref 211–911)

## 2018-09-01 LAB — T4, FREE: Free T4: 0.89 ng/dL (ref 0.60–1.60)

## 2018-09-01 NOTE — Assessment & Plan Note (Signed)
Mild in hands Doesn't sound like CTS--but possible Check labs If progresses, will go ahead with neurology evaluation

## 2018-09-01 NOTE — Progress Notes (Signed)
Subjective:    Patient ID: Ronnie [name], male    DOB: 07/16/1955, 63 y.o.   MRN: [id number]  HPI Here for physical  Still gets some tinnitus Overall, is better than before  Has some hand numbness at night---tingling Doesn't awaken him Resolves within a short time in the morning No daytime symptoms No hand weakness  General aches and pains Stays active but no set exercise Various projects keeps him busy in retirement  No current outpatient medications on file prior to visit.   No current facility-administered medications on file prior to visit.     No Known Allergies  Past Medical History:  Diagnosis Date  . Back pain 2009   with radiculopathy. Fell off roof bulging L4/L5 and a comp fx L5/S1  . Ear lump   . Numbness and tingling of foot    Right  . 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 BACK SURGERY    . Bulging disc L4/L5, comp fx L5/S1  2009   Post falling off roof    Family History  Problem Relation Age of Onset  . Cancer Mother        Liver  . Heart disease Father        rheumatic heart disease, no CAD  . Heart disease Paternal Uncle   . Heart disease Paternal Grandfather     Social History   Socioeconomic History  . Marital status: Married    Spouse name: Not on file  . Number of children: 2  . Years of education: Not on file  . Highest education level: Not on file  Occupational History  . 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 Financial resource strain: Not on file  . Food insecurity    Worry: Not on file    Inability: Not on file  . Transportation needs    Medical: Not on file    Non-medical: Not on file  Tobacco Use  . Smoking status: Never Smoker  . Smokeless tobacco: Never Used  Substance and Sexual Activity  . Alcohol use: Yes    Comment: rare beer  . Drug use: No  . Sexual activity: Not on file  Lifestyle  . Physical  activity    Days per week: Not on file    Minutes per session: Not on file  . Stress: Not on file  Relationships  . Social Herbalist on phone: Not on file    Gets together: Not on file    Attends religious service: Not on file    Active member of club or organization: Not on file    Attends meetings of clubs or organizations: Not on file    Relationship status: Not on file  . Intimate partner violence    Fear of current or ex partner: Not on file    Emotionally abused: Not on file    Physically abused: Not on file    Forced sexual activity: Not on file  Other Topics Concern  . Not on file  Social History Narrative   No regular exercise.   Review of Systems  Constitutional: Negative for fatigue and unexpected weight change.       Wears seat belt  HENT: Positive for tinnitus. Negative for dental problem and hearing loss.        Keeps up with dentist  Eyes: Negative for visual  disturbance.       No diplopia or unilateral vision loss  Respiratory: Negative for cough, chest tightness and shortness of breath.   Cardiovascular: Negative for chest pain and leg swelling.       Occ skip beats  Gastrointestinal: Negative for abdominal pain, blood in stool and constipation.       Rare heartburn  Endocrine: Negative for polydipsia and polyuria.  Genitourinary: Negative for urgency.       Weak stream but not bothersome No sexual problems  Musculoskeletal: Positive for myalgias. Negative for arthralgias, back pain and joint swelling.  Skin: Negative for rash.       Slight change in "mole" in right antecubital fossa  Allergic/Immunologic: Negative for environmental allergies and immunocompromised state.  Neurological: Negative for dizziness, syncope, light-headedness and headaches.  Hematological: Negative for adenopathy. Bruises/bleeds easily.  Psychiatric/Behavioral: Negative for dysphoric mood and sleep disturbance. The patient is not nervous/anxious.       Objective:    Physical Exam  Constitutional: He is oriented to person, place, and time. He appears well-developed. No distress.  HENT:  Head: Normocephalic and atraumatic.  Right Ear: External ear normal.  Left Ear: External ear normal.  Mouth/Throat: Oropharynx is clear and moist. No oropharyngeal exudate.  Eyes: Pupils are equal, round, and reactive to light. Conjunctivae are normal.  Neck: No thyromegaly present.  Cardiovascular: Normal rate, regular rhythm, normal heart sounds and intact distal pulses. Exam reveals no gallop.  No murmur heard. Respiratory: Effort normal and breath sounds normal. No respiratory distress. He has no wheezes. He has no rales.  GI: Soft. There is no abdominal tenderness.  Musculoskeletal:        General: No tenderness or edema.  Lymphadenopathy:    He has no cervical adenopathy.  Neurological: He is alert and oriented to person, place, and time.  Normal strength and sensation in hands  Skin: No rash noted. No erythema.  Psychiatric: He has a normal mood and affect. His behavior is normal.           Assessment & Plan:

## 2018-09-01 NOTE — Assessment & Plan Note (Signed)
Healthy Discussed fitness Colon due 2022 Discussed PSA---will go ahead Tdap today

## 2018-09-01 NOTE — Addendum Note (Signed)
Addended by: Pilar Grammes on: 09/01/2018 04:47 PM   Modules accepted: Orders

## 2018-09-02 ENCOUNTER — Other Ambulatory Visit: Payer: Self-pay | Admitting: Internal Medicine

## 2018-09-02 DIAGNOSIS — R972 Elevated prostate specific antigen [PSA]: Secondary | ICD-10-CM

## 2019-09-03 ENCOUNTER — Encounter: Payer: Self-pay | Admitting: Internal Medicine

## 2019-09-03 ENCOUNTER — Ambulatory Visit (INDEPENDENT_AMBULATORY_CARE_PROVIDER_SITE_OTHER): Payer: PRIVATE HEALTH INSURANCE | Admitting: Internal Medicine

## 2019-09-03 ENCOUNTER — Other Ambulatory Visit: Payer: Self-pay

## 2019-09-03 VITALS — BP 128/74 | HR 88 | Temp 98.0°F | Ht 71.0 in | Wt 190.1 lb

## 2019-09-03 DIAGNOSIS — N401 Enlarged prostate with lower urinary tract symptoms: Secondary | ICD-10-CM | POA: Diagnosis not present

## 2019-09-03 DIAGNOSIS — N138 Other obstructive and reflux uropathy: Secondary | ICD-10-CM

## 2019-09-03 DIAGNOSIS — R972 Elevated prostate specific antigen [PSA]: Secondary | ICD-10-CM | POA: Diagnosis not present

## 2019-09-03 DIAGNOSIS — R200 Anesthesia of skin: Secondary | ICD-10-CM

## 2019-09-03 DIAGNOSIS — Z0000 Encounter for general adult medical examination without abnormal findings: Secondary | ICD-10-CM | POA: Diagnosis not present

## 2019-09-03 NOTE — Assessment & Plan Note (Signed)
Right lateral foot Probably sequelae from disc and surgery years ago

## 2019-09-03 NOTE — Assessment & Plan Note (Addendum)
Will recheck levels and free % [place] if urology needed

## 2019-09-03 NOTE — Progress Notes (Signed)
Subjective:    Patient ID: Ronnie [name], male    DOB: Oct 13, 1955, 64 y.o.   MRN: [id number]  HPI Here for physical This visit occurred during the SARS-CoV-2 public health emergency.  Safety protocols were in place, including screening questions prior to the visit, additional usage of staff PPE, and extensive cleaning of exam room while observing appropriate contact time as indicated for disinfecting solutions.   Never got the repeat PSA--planned to check this today  Ongoing weak urinary stream Nocturia x 3 generally Some pressure--when first starting stream No sexual problems  No current outpatient medications on file prior to visit.   No current facility-administered medications on file prior to visit.    No Known Allergies  Past Medical History:  Diagnosis Date  . Back pain 2009   with radiculopathy. Fell off roof bulging L4/L5 and a comp fx L5/S1  . Ear lump   . Numbness and tingling of foot    Right  . 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 BACK SURGERY    . Bulging disc L4/L5, comp fx L5/S1  2009   Post falling off roof    Family History  Problem Relation Age of Onset  . Cancer Mother        Liver  . Heart disease Father        rheumatic heart disease, no CAD  . Heart disease Paternal Uncle   . Heart disease Paternal Grandfather     Social History   Socioeconomic History  . Marital status: Married    Spouse name: Not on file  . Number of children: 2  . Years of education: Not on file  . Highest education level: Not on file  Occupational History  . 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 Smoking status: Never Smoker  . Smokeless tobacco: Never Used  Substance and Sexual Activity  . Alcohol use: Yes    Comment: rare beer  . Drug use: No  . Sexual activity: Not on file  Other Topics Concern  . Not on file  Social History Narrative   No  regular exercise.   Social Determinants of Health   Financial Resource Strain:   . Difficulty of Paying Living Expenses:   Food Insecurity:   . Worried About Charity fundraiser in the Last Year:   . Arboriculturist in the Last Year:   Transportation Needs:   . Film/video editor (Medical):   Marland Kitchen Lack of Transportation (Non-Medical):   Physical Activity:   . Days of Exercise per Week:   . Minutes of Exercise per Session:   Stress:   . Feeling of Stress :   Social Connections:   . Frequency of Communication with Friends and Family:   . Frequency of Social Gatherings with Friends and Family:   . Attends Religious Services:   . Active Member of Clubs or Organizations:   . Attends Archivist Meetings:   Marland Kitchen Marital Status:   Intimate Partner Violence:   . Fear of Current or Ex-Partner:   . Emotionally Abused:   Marland Kitchen Physically Abused:   . Sexually Abused:    Review of Systems  Constitutional: Negative for fatigue.       Lost 7# Works hard with yardwork, etc---no set exercise Wears seat belt  HENT: Negative for dental problem, hearing loss and tinnitus.  Keeps up with dentist  Eyes: Negative for visual disturbance.       No diplopia or unilateral vision loss  Respiratory: Negative for cough, chest tightness and shortness of breath.   Cardiovascular: Negative for chest pain, palpitations and leg swelling.  Gastrointestinal: Negative for blood in stool and constipation.       No heartburn  Endocrine: Negative for polydipsia and polyuria.  Genitourinary: Positive for difficulty urinating and urgency.  Musculoskeletal: Negative for back pain and joint swelling.       Some sciatica on right side  Skin: Negative for rash.  Allergic/Immunologic: Negative for environmental allergies and immunocompromised state.  Neurological: Negative for dizziness, syncope, light-headedness and headaches.       Gets numbness in ulnar distribution when he awakens--goes away quick    Hematological: Negative for adenopathy. Does not bruise/bleed easily.  Psychiatric/Behavioral: Negative for dysphoric mood and sleep disturbance. The patient is not nervous/anxious.        Objective:   Physical Exam Constitutional:      General: He is not in acute distress.    Appearance: Normal appearance.  HENT:     Head: Normocephalic and atraumatic.     Right Ear: Tympanic membrane and ear canal normal.     Left Ear: Tympanic membrane and ear canal normal.     Mouth/Throat:     Mouth: Mucous membranes are moist.     Comments: No lesions Eyes:     Conjunctiva/sclera: Conjunctivae normal.     Pupils: Pupils are equal, round, and reactive to light.  Cardiovascular:     Rate and Rhythm: Normal rate and regular rhythm.     Pulses: Normal pulses.     Heart sounds: No murmur heard.  No gallop.   Pulmonary:     Effort: Pulmonary effort is normal.     Breath sounds: Normal breath sounds. No wheezing or rales.  Abdominal:     Palpations: Abdomen is soft.     Tenderness: There is no abdominal tenderness.  Musculoskeletal:     Cervical back: Neck supple.     Right lower leg: No edema.     Left lower leg: No edema.  Lymphadenopathy:     Cervical: No cervical adenopathy.  Skin:    General: Skin is warm.     Findings: No rash.  Neurological:     General: No focal deficit present.     Mental Status: He is alert and oriented to person, place, and time.  Psychiatric:        Mood and Affect: Mood normal.        Behavior: Behavior normal.            Assessment & Plan:

## 2019-09-03 NOTE — Assessment & Plan Note (Signed)
Healthy Colon due next year Recommended flu vaccine---he prefers not He stays active

## 2019-09-03 NOTE — Assessment & Plan Note (Signed)
He wants to wait for now Discussed tamsulosin for Rx if worsens

## 2019-09-04 LAB — PSA, TOTAL AND FREE
PSA, % Free: 21 % (calc) — ABNORMAL LOW (ref >25)
PSA, Free: 1.2 ng/mL
PSA, Total: 5.7 ng/mL — ABNORMAL HIGH (ref <=4.0)

## 2019-09-18 ENCOUNTER — Other Ambulatory Visit: Payer: PRIVATE HEALTH INSURANCE

## 2019-09-19 LAB — NOVEL CORONAVIRUS, NAA: SARS-CoV-2, NAA: NOT DETECTED

## 2019-09-19 LAB — SARS-COV-2, NAA 2 DAY TAT

## 2019-12-08 ENCOUNTER — Telehealth (INDEPENDENT_AMBULATORY_CARE_PROVIDER_SITE_OTHER): Payer: PRIVATE HEALTH INSURANCE | Admitting: Family Medicine

## 2019-12-08 ENCOUNTER — Encounter: Payer: Self-pay | Admitting: Family Medicine

## 2019-12-08 ENCOUNTER — Other Ambulatory Visit: Payer: Self-pay

## 2019-12-08 VITALS — Temp 98.4°F | Ht 71.0 in | Wt 183.0 lb

## 2019-12-08 DIAGNOSIS — R609 Edema, unspecified: Secondary | ICD-10-CM | POA: Insufficient documentation

## 2019-12-08 DIAGNOSIS — R634 Abnormal weight loss: Secondary | ICD-10-CM | POA: Diagnosis not present

## 2019-12-08 DIAGNOSIS — R5383 Other fatigue: Secondary | ICD-10-CM

## 2019-12-08 DIAGNOSIS — R630 Anorexia: Secondary | ICD-10-CM

## 2019-12-08 NOTE — Progress Notes (Signed)
Virtual visit completed through MyChart, a video enabled telemedicine application. Due to national recommendations of social distancing due to COVID-19, a virtual visit is felt to be most appropriate for this patient at this time. Reviewed limitations, risks, security and privacy concerns of performing a virtual visit and the availability of in person appointments. I also reviewed that there may be a patient responsible charge related to this service. The patient agreed to proceed.   Patient location: home Provider location: [facility], office Persons participating in this virtual visit: patient, provider   If any vitals were documented, they were collected by patient at home unless specified below.    Temp 98.4 F (36.9 C)   Ht 5' 11"  (1.803 m)   Wt 183 lb (83 kg)   BMI 25.52 kg/m    CC: fatigue, loss of appetite Subjective:    Patient ID: Ronnie [name], male    DOB: [birth year], 64 y.o.   MRN: [id number]  HPI: Ronnie [name] is a 64 y.o. male presenting on 12/08/2019 for Fatigue (C/o fatigue, loss of appetite, swelling off and on all over and feeling hot/cold.  Sxs started 12/03/19.  Seen at [facility] on 12/06/19. Given levoceterizine and famotidine. )   5d h/o ear pain, ear swelling with progression into sore throat (felt like gums were all scratched up), then areas of foot started swelling. Also associated with fatigue, vomiting x2, anorexia, chills and sweats without fever. Occasional night sweats. No other swelling throughout body.  Seen at [facility] - thought some kind of allergic reaction and treated with xyzal and pepcid with some benefit however had mental fogginess afterwards. He also had CBC and CMP checked at [facility] (results pending). He did have itching of chest throughout all of this (without rash) - did improve with xyzal   Ongoing fatigue, anorexia.  7 lb weight loss in the past 3 months, 14 lb weight loss since last year. Non-intentional.   Has  never had episode like this in the past.  Over the years has had issues with intermittent swelling (lips, feet, hands) associated with pruritis.   No swollen glands. No new arthralgias. No recent tick bites.  No fevers/chills, nasal congestion, cough, dyspnea, abd pain, diarrhea/constipation.   No new vitamins, supplements, foods, restaurants, meds.   No results found for: TSH   Elevated PSA, last checked 5.7 - actually lower than previously.  UTD colonoscopy 12/2015 - 1 polyp, rpt 5 yrs ([facility])  He is retired.      Relevant past medical, surgical, family and social history reviewed and updated as indicated. Interim medical history since our last visit reviewed. Allergies and medications reviewed and updated. No outpatient medications prior to visit.   No facility-administered medications prior to visit.     Per HPI unless specifically indicated in ROS section below Review of Systems Objective:  Temp 98.4 F (36.9 C)   Ht 5' 11"  (1.803 m)   Wt 183 lb (83 kg)   BMI 25.52 kg/m   Wt Readings from Last 3 Encounters:  12/08/19 183 lb (83 kg)  09/03/19 190 lb 1.9 oz (86.2 kg)  09/01/18 197 lb (89.4 kg)       Physical exam: Gen: alert, NAD, not ill appearing. No angioedema noted today.  Pulm: speaks in complete sentences without increased work of breathing Psych: normal mood, normal thought content      Results for orders placed or performed in visit on 09/18/19  Novel Coronavirus, NAA ([facility])  Specimen: Nasopharyngeal(NP) swabs in vial transport medium   Nasopharynge  Testing  Result Value Ref Range   SARS-CoV-2, NAA Not Detected Not Detected  SARS-COV-2, NAA 2 DAY TAT   Nasopharynge  Testing  Result Value Ref Range   SARS-CoV-2, NAA 2 DAY TAT Performed    Assessment & Plan:   Problem List Items Addressed This Visit    Swelling - Primary    Intermittent swelling of lips and feet and occasionally hands, acutely worse over the past 5 days, associated with  anorexia, fatigue, and noted progressive unintentional weight loss over the past 1 year. He recently was evaluated at [facility], CBC/CMP labs pending, treated for allergic reaction with antihistamines with benefit but feels xyzal too strong.  I have asked him to bring [place] copy of recent labwork to review, and at same time draw TSH, ESR/CRP and tryptophan for further evaluation r/o thyroid disease, mastocytosis, inflammatory state. Discussed possible allergist evaluation if unrevealing labs. Encouraged bland diet over next few days for bowel rest.  If ongoing wegiht loss, advised schedule in-office appt for physical exam and further evaluation.  Pt agrees with plan.       Relevant Orders   TSH   Sedimentation rate   Tryptase   C-reactive protein    Other Visit Diagnoses    Anorexia       Fatigue, unspecified type       Unintentional weight loss           No orders of the defined types were placed in this encounter.  Orders Placed This Encounter  Procedures  . TSH    Standing Status:   Future    Standing Expiration Date:   12/07/2020  . Sedimentation rate    Standing Status:   Future    Standing Expiration Date:   12/07/2020  . Tryptase    Standing Status:   Future    Standing Expiration Date:   12/07/2020  . C-reactive protein    Standing Status:   Future    Standing Expiration Date:   12/07/2020    I discussed the assessment and treatment plan with the patient. The patient was provided an opportunity to ask questions and all were answered. The patient agreed with the plan and demonstrated an understanding of the instructions. The patient was advised to call back or seek an in-person evaluation if the symptoms worsen or if the condition fails to improve as anticipated.  Follow up plan: No follow-ups on file.  Ria Bush, MD

## 2019-12-08 NOTE — Assessment & Plan Note (Signed)
Intermittent swelling of lips and feet and occasionally hands, acutely worse over the past 5 days, associated with anorexia, fatigue, and noted progressive unintentional weight loss over the past 1 year. He recently was evaluated at [facility], CBC/CMP labs pending, treated for allergic reaction with antihistamines with benefit but feels xyzal too strong.  I have asked him to bring [place] copy of recent labwork to review, and at same time draw TSH, ESR/CRP and tryptophan for further evaluation r/o thyroid disease, mastocytosis, inflammatory state. Discussed possible allergist evaluation if unrevealing labs. Encouraged bland diet over next few days for bowel rest.  If ongoing wegiht loss, advised schedule in-office appt for physical exam and further evaluation.  Pt agrees with plan.

## 2019-12-09 ENCOUNTER — Other Ambulatory Visit: Payer: Self-pay

## 2019-12-09 ENCOUNTER — Emergency Department (HOSPITAL_COMMUNITY): Payer: PRIVATE HEALTH INSURANCE

## 2019-12-09 ENCOUNTER — Encounter (HOSPITAL_COMMUNITY): Payer: Self-pay

## 2019-12-09 ENCOUNTER — Telehealth: Payer: Self-pay | Admitting: Internal Medicine

## 2019-12-09 ENCOUNTER — Inpatient Hospital Stay (HOSPITAL_COMMUNITY)
Admission: EM | Admit: 2019-12-09 | Discharge: 2019-12-15 | DRG: 442 | Disposition: A | Discharge status: Home / Self Care | Payer: PRIVATE HEALTH INSURANCE | Attending: Internal Medicine | Admitting: Internal Medicine

## 2019-12-09 DIAGNOSIS — R933 Abnormal findings on diagnostic imaging of other parts of digestive tract: Secondary | ICD-10-CM

## 2019-12-09 DIAGNOSIS — R161 Splenomegaly, not elsewhere classified: Secondary | ICD-10-CM | POA: Diagnosis present

## 2019-12-09 DIAGNOSIS — Z20822 Contact with and (suspected) exposure to covid-19: Secondary | ICD-10-CM | POA: Diagnosis present

## 2019-12-09 DIAGNOSIS — K7689 Other specified diseases of liver: Secondary | ICD-10-CM | POA: Diagnosis present

## 2019-12-09 DIAGNOSIS — D72829 Elevated white blood cell count, unspecified: Secondary | ICD-10-CM

## 2019-12-09 DIAGNOSIS — R7989 Other specified abnormal findings of blood chemistry: Secondary | ICD-10-CM

## 2019-12-09 DIAGNOSIS — T783XXA Angioneurotic edema, initial encounter: Secondary | ICD-10-CM | POA: Diagnosis present

## 2019-12-09 DIAGNOSIS — E876 Hypokalemia: Secondary | ICD-10-CM | POA: Diagnosis present

## 2019-12-09 DIAGNOSIS — R972 Elevated prostate specific antigen [PSA]: Secondary | ICD-10-CM | POA: Diagnosis present

## 2019-12-09 DIAGNOSIS — B251 Cytomegaloviral hepatitis: Secondary | ICD-10-CM | POA: Diagnosis not present

## 2019-12-09 DIAGNOSIS — R17 Unspecified jaundice: Secondary | ICD-10-CM | POA: Diagnosis not present

## 2019-12-09 DIAGNOSIS — N138 Other obstructive and reflux uropathy: Secondary | ICD-10-CM | POA: Diagnosis present

## 2019-12-09 DIAGNOSIS — N401 Enlarged prostate with lower urinary tract symptoms: Secondary | ICD-10-CM | POA: Diagnosis present

## 2019-12-09 DIAGNOSIS — R7401 Elevation of levels of liver transaminase levels: Secondary | ICD-10-CM

## 2019-12-09 LAB — COMPREHENSIVE METABOLIC PANEL
ALT: 81 U/L — ABNORMAL HIGH (ref 0–44)
AST: 49 U/L — ABNORMAL HIGH (ref 15–41)
Albumin: 3.2 g/dL — ABNORMAL LOW (ref 3.5–5.0)
Alkaline Phosphatase: 230 U/L — ABNORMAL HIGH (ref 38–126)
Anion gap: 14 (ref 5–15)
BUN: 13 mg/dL (ref 8–23)
CO2: 24 mmol/L (ref 22–32)
Calcium: 9.2 mg/dL (ref 8.9–10.3)
Chloride: 95 mmol/L — ABNORMAL LOW (ref 98–111)
Creatinine, Ser: 0.7 mg/dL (ref 0.61–1.24)
GFR, Estimated: >60 mL/min (ref >60)
Glucose, Bld: 100 mg/dL — ABNORMAL HIGH (ref 70–99)
Potassium: 3.5 mmol/L (ref 3.5–5.1)
Sodium: 133 mmol/L — ABNORMAL LOW (ref 135–145)
Total Bilirubin: 18.6 mg/dL (ref 0.3–1.2)
Total Protein: 7.3 g/dL (ref 6.5–8.1)

## 2019-12-09 LAB — RESPIRATORY PANEL BY RT PCR (FLU A&B, COVID)
Influenza A by PCR: NEGATIVE
Influenza B by PCR: NEGATIVE
SARS Coronavirus 2 by RT PCR: NEGATIVE

## 2019-12-09 LAB — ETHANOL: Alcohol, Ethyl (B): <10 mg/dL (ref <10)

## 2019-12-09 LAB — I-STAT CHEM 8, ED
BUN: 11 mg/dL (ref 8–23)
Calcium, Ion: 0.95 mmol/L — ABNORMAL LOW (ref 1.15–1.40)
Chloride: 103 mmol/L (ref 98–111)
Creatinine, Ser: 0.7 mg/dL (ref 0.61–1.24)
Glucose, Bld: 82 mg/dL (ref 70–99)
HCT: 38 % — ABNORMAL LOW (ref 39.0–52.0)
Hemoglobin: 12.9 g/dL — ABNORMAL LOW (ref 13.0–17.0)
Potassium: 2.8 mmol/L — ABNORMAL LOW (ref 3.5–5.1)
Sodium: 138 mmol/L (ref 135–145)
TCO2: 21 mmol/L — ABNORMAL LOW (ref 22–32)

## 2019-12-09 LAB — CBC WITH DIFFERENTIAL/PLATELET
Abs Immature Granulocytes: 0.04 10*3/uL (ref 0.00–0.07)
Basophils Absolute: 0.1 10*3/uL (ref 0.0–0.1)
Basophils Relative: 0 %
Eosinophils Absolute: 0.5 10*3/uL (ref 0.0–0.5)
Eosinophils Relative: 4 %
HCT: 43.5 % (ref 39.0–52.0)
Hemoglobin: 14.5 g/dL (ref 13.0–17.0)
Immature Granulocytes: 0 %
Lymphocytes Relative: 11 %
Lymphs Abs: 1.3 10*3/uL (ref 0.7–4.0)
MCH: 29.3 pg (ref 26.0–34.0)
MCHC: 33.3 g/dL (ref 30.0–36.0)
MCV: 87.9 fL (ref 80.0–100.0)
Monocytes Absolute: 1.2 10*3/uL — ABNORMAL HIGH (ref 0.1–1.0)
Monocytes Relative: 10 %
Neutro Abs: 8.8 10*3/uL — ABNORMAL HIGH (ref 1.7–7.7)
Neutrophils Relative %: 75 %
Platelets: 317 10*3/uL (ref 150–400)
RBC: 4.95 MIL/uL (ref 4.22–5.81)
RDW: 14.1 % (ref 11.5–15.5)
WBC: 11.8 10*3/uL — ABNORMAL HIGH (ref 4.0–10.5)
nRBC: 0 % (ref 0.0–0.2)

## 2019-12-09 LAB — PROTIME-INR
INR: 1.3 — ABNORMAL HIGH (ref 0.8–1.2)
Prothrombin Time: 15.3 seconds — ABNORMAL HIGH (ref 11.4–15.2)

## 2019-12-09 LAB — LIPASE, BLOOD: Lipase: 28 U/L (ref 11–51)

## 2019-12-09 LAB — LACTIC ACID, PLASMA: Lactic Acid, Venous: 1.2 mmol/L (ref 0.5–1.9)

## 2019-12-09 LAB — ACETAMINOPHEN LEVEL: Acetaminophen (Tylenol), Serum: <10 ug/mL — ABNORMAL LOW (ref 10–30)

## 2019-12-09 MED ORDER — IOHEXOL 300 MG/ML  SOLN
100.0000 mL | Freq: Once | INTRAMUSCULAR | Status: AC | PRN
  Administered 2019-12-09: 100 mL via INTRAVENOUS

## 2019-12-09 NOTE — Telephone Encounter (Signed)
Spoke with Ronnie [name] who was speaking with patient - verified with patient no current nausea, vomiting, abd pain, fever, orthostatic dizziness. Ok to proceed with outpatient CT.  Red flags to seek ER care reviewed with patient.  Pending CT at 1pm today.

## 2019-12-09 NOTE — Telephone Encounter (Signed)
I am working on precert and scheduling the CT

## 2019-12-09 NOTE — H&P (Signed)
History and Physical    Ronnie [name] DOB: 09/09/1955 DOA: 12/09/2019  PCP: Venia Carbon, MD  Patient coming from: Home.  Chief Complaint: Abnormal labs.  HPI: Ronnie [name] is a 64 y.o. male with no significant past medical history was referred to the ER after patient had abnormal labs done as outpatient.  Patient states about 5 days ago patient started having intermittent swelling of his lips tongue throat ears and itching of the trunk and extremities off and on.  Patient had a similar symptom about 6 months ago which resolved without any intervention.  But at this time it is more protracted.  Given the symptoms patient had gone to the urgent care about 3 days ago and at that time patient was given antihistamine and Pepcid.  After taking the medication patient symptoms resolved.  Patient states that during that visit patient had blood drawn which showed abnormality and patient's primary care physician referred patient to the ER.  Patient denies drinking alcohol and using Tylenol or any recent outside travel.  ED Course: On exam in the ER patient appears jaundiced.  Labs are significant for total bilirubin of 18.6 alkaline phosphatase 230 lipase 28 AST 49 ALT 81 sodium 133 WBC 11.8.  CT abdomen pelvis and sonogram were unremarkable.  Acute hepatitis panel has been drawn and patient admitted for further observation.  [facility] was notified.  Review of Systems: As per HPI, rest all negative.   Past Medical History:  Diagnosis Date  . Back pain 2009   with radiculopathy. Fell off roof bulging L4/L5 and a comp fx L5/S1  . Ear lump   . Numbness and tingling of foot    Right  . 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 BACK SURGERY    . Bulging disc L4/L5, comp fx L5/S1  2009   Post falling off roof     reports that he has never smoked. He has never used smokeless tobacco. He reports current  alcohol use. He reports that he does not use drugs.  No Known Allergies  Family History  Problem Relation Age of Onset  . Cancer Mother        Liver  . Heart disease Father        rheumatic heart disease, no CAD  . Heart disease Paternal Uncle   . Heart disease Paternal Grandfather     Prior to Admission medications   Not on File    Physical Exam: Constitutional: Moderately built and nourished. Vitals:   12/09/19 1342 12/09/19 1900 12/09/19 1956  BP: 124/77 107/72 119/69  Pulse: 92 93 91  Resp: 16  16  Temp: 98.1 F (36.7 C)    TempSrc: Oral    SpO2: 97% 95% 97%   Eyes: Icterus present.  No pallor. ENMT: No discharge from the ears eyes nose or mouth no tongue swelling no lip swelling. Neck: No mass felt.  No neck rigidity. Respiratory: No rhonchi or crepitations. Cardiovascular: S1-S2 heard. Abdomen: Soft nontender bowel sounds present. Musculoskeletal: No edema. Skin: No rash. Neurologic: Alert awake oriented to time place and person.  Moves all extremities. Psychiatric: Appears normal.  Normal affect.   Labs on Admission: I have personally reviewed following labs and imaging studies  CBC: Recent Labs  Lab 12/09/19 1856 12/09/19 1919  WBC 11.8*  --   NEUTROABS 8.8*  --   HGB 14.5 12.9*  HCT 43.5 38.0*  MCV 87.9  --   PLT 317  --    Basic Metabolic Panel: Recent Labs  Lab 12/09/19 1856 12/09/19 1919  NA 133* 138  K 3.5 2.8*  CL 95* 103  CO2 24  --   GLUCOSE 100* 82  BUN 13 11  CREATININE 0.70 0.70  CALCIUM 9.2  --    GFR: Estimated Creatinine Clearance: 100.7 mL/min (by C-G formula based on SCr of 0.7 mg/dL). Liver Function Tests: Recent Labs  Lab 12/09/19 1856  AST 49*  ALT 81*  ALKPHOS 230*  BILITOT 18.6*  PROT 7.3  ALBUMIN 3.2*   Recent Labs  Lab 12/09/19 1856  LIPASE 28   No results for input(s): AMMONIA in the last 168 hours. Coagulation Profile: Recent Labs  Lab 12/09/19 1856  INR 1.3*   Cardiac Enzymes: No results  for input(s): CKTOTAL, CKMB, CKMBINDEX, TROPONINI in the last 168 hours. BNP (last 3 results) No results for input(s): PROBNP in the last 8760 hours. HbA1C: No results for input(s): HGBA1C in the last 72 hours. CBG: No results for input(s): GLUCAP in the last 168 hours. Lipid Profile: No results for input(s): CHOL, HDL, LDLCALC, TRIG, CHOLHDL, LDLDIRECT in the last 72 hours. Thyroid Function Tests: No results for input(s): TSH, T4TOTAL, FREET4, T3FREE, THYROIDAB in the last 72 hours. Anemia Panel: No results for input(s): VITAMINB12, FOLATE, FERRITIN, TIBC, IRON, RETICCTPCT in the last 72 hours. Urine analysis:    Component Value Date/Time   COLORURINE YELLOW 06/10/2016 2150   APPEARANCEUR HAZY (A) 06/10/2016 2150   LABSPEC 1.019 06/10/2016 2150   PHURINE 7.0 06/10/2016 2150   GLUCOSEU NEGATIVE 06/10/2016 2150   HGBUR SMALL (A) 06/10/2016 2150   BILIRUBINUR NEGATIVE 06/10/2016 2150   BILIRUBINUR negative 05/31/2016 1037   KETONESUR 5 (A) 06/10/2016 2150   PROTEINUR 100 (A) 06/10/2016 2150   UROBILINOGEN 0.2 05/31/2016 1037   UROBILINOGEN 1.0 08/06/2007 1008   NITRITE NEGATIVE 06/10/2016 2150   LEUKOCYTESUR NEGATIVE 06/10/2016 2150   Sepsis Labs: @LABRCNTIP (procalcitonin:4,lacticidven:4) ) Recent Results (from the past 240 hour(s))  Respiratory Panel by RT PCR (Flu A&B, Covid) - Nasopharyngeal Swab     Status: None   Collection Time: 12/09/19  7:55 PM   Specimen: Nasopharyngeal Swab  Result Value Ref Range Status   SARS Coronavirus 2 by RT PCR NEGATIVE NEGATIVE Final    Comment: (NOTE) SARS-CoV-2 target nucleic acids are NOT DETECTED.  The SARS-CoV-2 RNA is generally detectable in upper respiratoy specimens during the acute phase of infection. The lowest concentration of SARS-CoV-2 viral copies this assay can detect is 131 copies/mL. A negative result does not preclude SARS-Cov-2 infection and should not be used as the sole basis for treatment or other patient  management decisions. A negative result may occur with  improper specimen collection/handling, submission of specimen other than nasopharyngeal swab, presence of viral mutation(s) within the areas targeted by this assay, and inadequate number of viral copies (<131 copies/mL). A negative result must be combined with clinical observations, patient history, and epidemiological information. The expected result is Negative.  Fact Sheet for Patients:  PinkCheek.be  Fact Sheet for Healthcare Providers:  GravelBags.it  This test is no t yet approved or cleared by the [place] FDA and  has been authorized for detection and/or diagnosis of SARS-CoV-2 by FDA under an Emergency Use Authorization (EUA). This EUA will remain  in effect (meaning this test can be used) for the duration of the COVID-19 declaration under Section 564(b)(1) of  the Act, 21 U.S.C. section 360bbb-3(b)(1), unless the authorization is terminated or revoked sooner.     Influenza A by PCR NEGATIVE NEGATIVE Final   Influenza B by PCR NEGATIVE NEGATIVE Final    Comment: (NOTE) The Xpert Xpress SARS-CoV-2/FLU/RSV assay is intended as an aid in  the diagnosis of influenza from Nasopharyngeal swab specimens and  should not be used as a sole basis for treatment. Nasal washings and  aspirates are unacceptable for Xpert Xpress SARS-CoV-2/FLU/RSV  testing.  Fact Sheet for Patients: PinkCheek.be  Fact Sheet for Healthcare Providers: GravelBags.it  This test is not yet approved or cleared by the [place] FDA and  has been authorized for detection and/or diagnosis of SARS-CoV-2 by  FDA under an Emergency Use Authorization (EUA). This EUA will remain  in effect (meaning this test can be used) for the duration of the  Covid-19 declaration under Section 564(b)(1) of the Act, 21  U.S.C. section 360bbb-3(b)(1),  unless the authorization is  terminated or revoked. Performed at [facility], [place] [address], [place], [place] [address]      Radiological Exams on Admission: CT ABDOMEN PELVIS W CONTRAST  Result Date: 12/09/2019 CLINICAL DATA:  Abdominal pain. EXAM: CT ABDOMEN AND PELVIS WITH CONTRAST TECHNIQUE: Multidetector CT imaging of the abdomen and pelvis was performed using the standard protocol following bolus administration of intravenous contrast. CONTRAST:  167mL OMNIPAQUE IOHEXOL 300 MG/ML  SOLN COMPARISON:  June 10, 2016 FINDINGS: Lower chest: No acute abnormality. Hepatobiliary: No focal liver abnormality is seen. No gallstones, gallbladder wall thickening, or biliary dilatation. Pancreas: Unremarkable. No pancreatic ductal dilatation or surrounding inflammatory changes. Spleen: Normal in size without focal abnormality. Adrenals/Urinary Tract: Adrenal glands are unremarkable. Kidneys are normal, without renal calculi, focal lesion, or hydronephrosis. Bladder is unremarkable. Stomach/Bowel: Stomach is within normal limits. Appendix appears normal. No evidence of bowel wall thickening, distention, or inflammatory changes. Vascular/Lymphatic: No significant vascular findings are present. No enlarged abdominal or pelvic lymph nodes. Reproductive: The prostate gland is moderately enlarged. Other: No abdominal wall hernia or abnormality. No abdominopelvic ascites. Musculoskeletal: Moderate severity degenerative changes are seen at the levels of L4-L5 and L5-S1. IMPRESSION: 1. No CT evidence of acute or active process within the abdomen or pelvis. 2. Moderate severity degenerative changes at the levels of L4-L5 and L5-S1. 3. Enlarged prostate gland. Electronically Signed   By: Virgina Norfolk M.D.   On: 12/09/2019 20:04   US Abdomen Limited RUQ (LIVER/GB)  Result Date: 12/09/2019 CLINICAL DATA:  Hyperbilirubinemia EXAM: ULTRASOUND ABDOMEN LIMITED RIGHT UPPER QUADRANT COMPARISON:  CT  12/09/2019 FINDINGS: Gallbladder: No gallstones or wall thickening visualized. No sonographic Murphy sign noted by sonographer. Common bile duct: Diameter: Normal caliber, 4 mm Liver: No focal lesion identified. Within normal limits in parenchymal echogenicity. Portal vein is patent on color Doppler imaging with normal direction of blood flow towards the liver. Other: None. IMPRESSION: Normal right upper quadrant ultrasound. Electronically Signed   By: Rolm Baptise M.D.   On: 12/09/2019 20:27     Assessment/Plan Principal Problem:   Jaundice Active Problems:   BPH with obstruction/lower urinary tract symptoms   Elevated PSA    1. Jaundice with intermittent symptoms of facial swelling involving the lips at times extremities with urticaria -cause not clear.  Acute hepatitis panel is pending.  Will in addition check sed rate CRP.  Patient's primary care physician also was concerned about mastocytosis so we will check tryptophan levels.  Follow LFTs closely consult GI. 2. History  of elevated PSA being followed by primary care physician.   DVT prophylaxis: SCDs.  Since patient has abnormal LFTs will need to follow INR. Code Status: Full code. Family Communication: Discussed with patient. Disposition Plan: Home. Consults called: ER physician sent message for [facility]. Admission status: Observation.   Rise Patience MD [facility] Pager [phone].  If 7PM-7AM, please contact night-coverage [email or web address] Password [email or web address]  12/09/2019, 9:55 PM

## 2019-12-09 NOTE — ED Provider Notes (Signed)
[facility] DEPT Provider Note   CSN: [id number] Arrival date & time: 12/09/19  1338     History Chief Complaint  Patient presents with   Referred for ABD CT    Ronnie [name] is a 64 y.o. male presenting for evaluation of of abnormal liver enzymes.  Patient states for the past few days, he has not been feeling well.  He reports extreme fatigue, decreased appetite.  This began with intermittent swelling of his ears and his feet as well as itching of his chest.  The symptoms have resolved, however he continues to feel very weak.  His wife noticed today that his skin had an abnormal color.  Sent to the ER for CT scan.  He denies fevers, chills, chest pain, shortness of breath, nausea, vomiting, abdominal pain, urinary symptoms, normal bowel movements.  He reports no previous history of GI problems, has never seen GI before.  No abdominal surgeries.  He denies etoh or Tylenol use.  No change in medications. He does not know if he has been tested for hepatitis in the past.   Additional history obtained from chart review.  Per chart review, patient was seen at an urgent care and had labs drawn which showed an elevated bili around 10.  HPI     Past Medical History:  Diagnosis Date   Back pain 2009   with radiculopathy. Fell off roof bulging L4/L5 and a comp fx L5/S1   Ear lump    Numbness and tingling of foot    Right   Numerous moles    Has always had multiple nevi    Patient Active Problem List   Diagnosis Date Noted   Swelling 12/08/2019   BPH with obstruction/lower urinary tract symptoms 09/03/2019   Elevated PSA 09/03/2019   Preventative health care 09/01/2018   Sensory loss 09/01/2018   Tinnitus 06/25/2018   NEVI, MULTIPLE 02/02/2009    Past Surgical History:  Procedure Laterality Date   BACK SURGERY     Bulging disc L4/L5, comp fx L5/S1  2009   Post falling off roof       Family History  Problem Relation Age of  Onset   Cancer Mother        Liver   Heart disease Father        rheumatic heart disease, no CAD   Heart disease Paternal Uncle    Heart disease Paternal Grandfather     Social History   Tobacco Use   Smoking status: Never Smoker   Smokeless tobacco: Never Used  Substance Use Topics   Alcohol use: Yes    Comment: rare beer   Drug use: No    Home Medications Prior to Admission medications   Not on File    Allergies    Patient has no known allergies.  Review of Systems   Review of Systems  Constitutional: Positive for appetite change and fatigue.  Skin: Positive for color change.  All other systems reviewed and are negative.   Physical Exam Updated Vital Signs BP 119/69    Pulse 91    Temp 98.1 F (36.7 C) (Oral)    Resp 16    SpO2 97%   Physical Exam Vitals and nursing note reviewed.  Constitutional:      General: He is not in acute distress.    Appearance: He is well-developed.     Comments: Appears jaundiced, otherwise nontoxic  HENT:     Head: Normocephalic and atraumatic.  Eyes:  General: Scleral icterus present.     Conjunctiva/sclera: Conjunctivae normal.     Pupils: Pupils are equal, round, and reactive to light.  Cardiovascular:     Rate and Rhythm: Normal rate and regular rhythm.     Pulses: Normal pulses.  Pulmonary:     Effort: Pulmonary effort is normal. No respiratory distress.     Breath sounds: Normal breath sounds. No wheezing.  Abdominal:     General: There is no distension.     Palpations: Abdomen is soft. There is no mass.     Tenderness: There is no abdominal tenderness. There is no guarding or rebound.     Comments: No TTP of the abdomen  Musculoskeletal:        General: Normal range of motion.     Cervical back: Normal range of motion and neck supple.  Skin:    General: Skin is warm and dry.     Capillary Refill: Capillary refill takes less than 2 seconds.     Coloration: Skin is jaundiced.  Neurological:      Mental Status: He is alert and oriented to person, place, and time.     ED Results / Procedures / Treatments   Labs (all labs ordered are listed, but only abnormal results are displayed) Labs Reviewed  CBC WITH DIFFERENTIAL/PLATELET - Abnormal; Notable for the following components:      Result Value   WBC 11.8 (*)    Neutro Abs 8.8 (*)    Monocytes Absolute 1.2 (*)    All other components within normal limits  COMPREHENSIVE METABOLIC PANEL - Abnormal; Notable for the following components:   Sodium 133 (*)    Chloride 95 (*)    Glucose, Bld 100 (*)    Albumin 3.2 (*)    AST 49 (*)    ALT 81 (*)    Alkaline Phosphatase 230 (*)    Total Bilirubin 18.6 (*)    All other components within normal limits  PROTIME-INR - Abnormal; Notable for the following components:   Prothrombin Time 15.3 (*)    INR 1.3 (*)    All other components within normal limits  ACETAMINOPHEN LEVEL - Abnormal; Notable for the following components:   Acetaminophen (Tylenol), Serum <10 (*)    All other components within normal limits  I-STAT CHEM 8, ED - Abnormal; Notable for the following components:   Potassium 2.8 (*)    Calcium, Ion 0.95 (*)    TCO2 21 (*)    Hemoglobin 12.9 (*)    HCT 38.0 (*)    All other components within normal limits  RESPIRATORY PANEL BY RT PCR (FLU A&B, COVID)  LIPASE, BLOOD  LACTIC ACID, PLASMA  ETHANOL  LACTIC ACID, PLASMA  HEPATITIS PANEL, ACUTE    EKG None  Radiology CT ABDOMEN PELVIS W CONTRAST  Result Date: 12/09/2019 CLINICAL DATA:  Abdominal pain. EXAM: CT ABDOMEN AND PELVIS WITH CONTRAST TECHNIQUE: Multidetector CT imaging of the abdomen and pelvis was performed using the standard protocol following bolus administration of intravenous contrast. CONTRAST:  129mL OMNIPAQUE IOHEXOL 300 MG/ML  SOLN COMPARISON:  June 10, 2016 FINDINGS: Lower chest: No acute abnormality. Hepatobiliary: No focal liver abnormality is seen. No gallstones, gallbladder wall thickening, or  biliary dilatation. Pancreas: Unremarkable. No pancreatic ductal dilatation or surrounding inflammatory changes. Spleen: Normal in size without focal abnormality. Adrenals/Urinary Tract: Adrenal glands are unremarkable. Kidneys are normal, without renal calculi, focal lesion, or hydronephrosis. Bladder is unremarkable. Stomach/Bowel: Stomach is within normal limits. Appendix  appears normal. No evidence of bowel wall thickening, distention, or inflammatory changes. Vascular/Lymphatic: No significant vascular findings are present. No enlarged abdominal or pelvic lymph nodes. Reproductive: The prostate gland is moderately enlarged. Other: No abdominal wall hernia or abnormality. No abdominopelvic ascites. Musculoskeletal: Moderate severity degenerative changes are seen at the levels of L4-L5 and L5-S1. IMPRESSION: 1. No CT evidence of acute or active process within the abdomen or pelvis. 2. Moderate severity degenerative changes at the levels of L4-L5 and L5-S1. 3. Enlarged prostate gland. Electronically Signed   By: Virgina Norfolk M.D.   On: 12/09/2019 20:04   US Abdomen Limited RUQ (LIVER/GB)  Result Date: 12/09/2019 CLINICAL DATA:  Hyperbilirubinemia EXAM: ULTRASOUND ABDOMEN LIMITED RIGHT UPPER QUADRANT COMPARISON:  CT 12/09/2019 FINDINGS: Gallbladder: No gallstones or wall thickening visualized. No sonographic Murphy sign noted by sonographer. Common bile duct: Diameter: Normal caliber, 4 mm Liver: No focal lesion identified. Within normal limits in parenchymal echogenicity. Portal vein is patent on color Doppler imaging with normal direction of blood flow towards the liver. Other: None. IMPRESSION: Normal right upper quadrant ultrasound. Electronically Signed   By: Rolm Baptise M.D.   On: 12/09/2019 20:27    Procedures Procedures (including critical care time)  Medications Ordered in ED Medications  iohexol (OMNIPAQUE) 300 MG/ML solution 100 mL (100 mLs Intravenous Contrast Given 12/09/19 1932)     ED Course  I have reviewed the triage vital signs and the nursing notes.  Pertinent labs & imaging results that were available during my care of the patient were reviewed by me and considered in my medical decision making (see chart for details).    MDM Rules/Calculators/A&P                          Patient presenting for evaluation of fatigue, decreased appetite, and skin color changes.  On exam, patient appears jaundiced, but otherwise nontoxic.  He has no abdominal tenderness.  Concern for gallbladder/liver etiology, consider obstruction versus cancer versus infection.  Consider pancreatic cancer.  Consider hepatitis.  Will order labs, CT, and ultrasound.  Patient will likely need to be admitted.  Labs interpreted by me, shows elevated bili at 18.8, this is increased from a few days ago when it was 10.  LFTs minimally elevated.  No leukocytosis.  Potassium is mildly low at 2.8, likely secondary to decreased p.o. intake.  CT and ultrasound negative for acute findings.  Patient will need to be admitted to medicine with GI consultation. Case discussed with attending, Dr. Roslynn Amble evaluated the pt.   Discussed with Dr, Hal Hope from [facility] hospitalist service, pt to be admitted.   Final Clinical Impression(s) / ED Diagnoses Final diagnoses:  Hyperbilirubinemia    Rx / DC Orders ED Discharge Orders    None       Franchot Heidelberg, PA-C 12/09/19 2049    Lucrezia Starch, MD 12/09/19 2345

## 2019-12-09 NOTE — Telephone Encounter (Signed)
Reviewed labs - TBili 10.8, ALP 263, AST 74, ALT 177, WBC 14, Cr 1.1.  Recommend stat CT scan to further evaluate liver - have ordered. If worsening weakness, nausea/vomiting, abd pain, fever, or unable to tolerate PO, recommend ER evaluation.

## 2019-12-09 NOTE — Telephone Encounter (Signed)
Dr Darnell Level has the labs from the tower.

## 2019-12-09 NOTE — Telephone Encounter (Signed)
Reviewed with Dr Darnell Level as well With those symptoms, he should go to the ER now---and they can evaluate him and will do the CT there

## 2019-12-09 NOTE — ED Triage Notes (Signed)
Pt reports from his PCP for an abdominal CT. Pt reports his PCP believes he needs a CT secondary to possible blood borne pathogens in his blood stream and some related liver issues. Pt denies any pain.

## 2019-12-09 NOTE — Telephone Encounter (Signed)
Noted - will await ER eval.

## 2019-12-09 NOTE — Telephone Encounter (Signed)
Please track these down ASAP Sounds like he may need ultrasound or CT scan

## 2019-12-09 NOTE — Telephone Encounter (Signed)
Spoke to pt's wife. They will take him to [facility] ER. I spoke to [employer], [employer].

## 2019-12-09 NOTE — Telephone Encounter (Signed)
Spoke to pt's wife and pt. He said he cannot stay awake for more than an hour without feeling bad. Scalp feels sensitive. If he stands up too quick he feels dizzy. Gets short of breath. No energy. Itching on chest and scalp.

## 2019-12-09 NOTE — Telephone Encounter (Signed)
Labs already addressed by Dr Darnell Level (not sure why he got them--but I would have ordered CT also) Let wife know the plan

## 2019-12-09 NOTE — Telephone Encounter (Signed)
I was informed after scheduling CT scan that the patient is now going to the [facility] ED as directed per Dr Silvio Pate. CT to be cancelled.  Dr Darnell Level has been made aware of this plan.   Will send to Dr Darnell Level as Juluis Rainier

## 2019-12-09 NOTE — Telephone Encounter (Signed)
Received lab results. Placed in lab tower.

## 2019-12-09 NOTE — Telephone Encounter (Signed)
Ronnie [name] (spouse) called   She stated pt is getting weaker by day.   She stated her daughter in law saw pt  Yesterday and she commented pt skin had yellow tint and eye also yellow she stated daughter in law is a [employer] scientist.   They received lab work from [facility] last night  when he went on 11/17.  Pt liver enzymes were really high.  Ronnie [name] is going to email lab results to [email or web address]  Ronnie [name] declined pt to be triaged pt still resting  Had rough night.    Best number for julie [phone] Pt had virtual 10/19 with dr g and labs schedule for 10/21

## 2019-12-10 ENCOUNTER — Other Ambulatory Visit: Payer: PRIVATE HEALTH INSURANCE

## 2019-12-10 DIAGNOSIS — N401 Enlarged prostate with lower urinary tract symptoms: Secondary | ICD-10-CM | POA: Diagnosis present

## 2019-12-10 DIAGNOSIS — R768 Other specified abnormal immunological findings in serum: Secondary | ICD-10-CM | POA: Diagnosis not present

## 2019-12-10 DIAGNOSIS — T783XXA Angioneurotic edema, initial encounter: Secondary | ICD-10-CM | POA: Diagnosis present

## 2019-12-10 DIAGNOSIS — N138 Other obstructive and reflux uropathy: Secondary | ICD-10-CM | POA: Diagnosis present

## 2019-12-10 DIAGNOSIS — R82998 Other abnormal findings in urine: Secondary | ICD-10-CM

## 2019-12-10 DIAGNOSIS — B259 Cytomegaloviral disease, unspecified: Secondary | ICD-10-CM | POA: Diagnosis not present

## 2019-12-10 DIAGNOSIS — K7689 Other specified diseases of liver: Secondary | ICD-10-CM | POA: Diagnosis present

## 2019-12-10 DIAGNOSIS — R161 Splenomegaly, not elsewhere classified: Secondary | ICD-10-CM | POA: Diagnosis present

## 2019-12-10 DIAGNOSIS — R945 Abnormal results of liver function studies: Secondary | ICD-10-CM | POA: Diagnosis not present

## 2019-12-10 DIAGNOSIS — B251 Cytomegaloviral hepatitis: Secondary | ICD-10-CM | POA: Diagnosis present

## 2019-12-10 DIAGNOSIS — R7989 Other specified abnormal findings of blood chemistry: Secondary | ICD-10-CM | POA: Diagnosis not present

## 2019-12-10 DIAGNOSIS — Z20822 Contact with and (suspected) exposure to covid-19: Secondary | ICD-10-CM | POA: Diagnosis present

## 2019-12-10 DIAGNOSIS — R17 Unspecified jaundice: Secondary | ICD-10-CM | POA: Diagnosis not present

## 2019-12-10 DIAGNOSIS — E876 Hypokalemia: Secondary | ICD-10-CM | POA: Diagnosis present

## 2019-12-10 LAB — CBC WITH DIFFERENTIAL/PLATELET
Abs Immature Granulocytes: 0.04 10*3/uL (ref 0.00–0.07)
Basophils Absolute: 0 10*3/uL (ref 0.0–0.1)
Basophils Relative: 0 %
Eosinophils Absolute: 0.5 10*3/uL (ref 0.0–0.5)
Eosinophils Relative: 6 %
HCT: 37.5 % — ABNORMAL LOW (ref 39.0–52.0)
Hemoglobin: 13 g/dL (ref 13.0–17.0)
Immature Granulocytes: 0 %
Lymphocytes Relative: 9 %
Lymphs Abs: 0.8 10*3/uL (ref 0.7–4.0)
MCH: 30 pg (ref 26.0–34.0)
MCHC: 34.7 g/dL (ref 30.0–36.0)
MCV: 86.6 fL (ref 80.0–100.0)
Monocytes Absolute: 1.1 10*3/uL — ABNORMAL HIGH (ref 0.1–1.0)
Monocytes Relative: 12 %
Neutro Abs: 6.4 10*3/uL (ref 1.7–7.7)
Neutrophils Relative %: 73 %
Platelets: 273 10*3/uL (ref 150–400)
RBC: 4.33 MIL/uL (ref 4.22–5.81)
RDW: 14 % (ref 11.5–15.5)
WBC: 8.9 10*3/uL (ref 4.0–10.5)
nRBC: 0 % (ref 0.0–0.2)

## 2019-12-10 LAB — HEPATIC FUNCTION PANEL
ALT: 64 U/L — ABNORMAL HIGH (ref 0–44)
AST: 49 U/L — ABNORMAL HIGH (ref 15–41)
Albumin: 2.7 g/dL — ABNORMAL LOW (ref 3.5–5.0)
Alkaline Phosphatase: 204 U/L — ABNORMAL HIGH (ref 38–126)
Bilirubin, Direct: 9.7 mg/dL — ABNORMAL HIGH (ref 0.0–0.2)
Indirect Bilirubin: 5.9 mg/dL — ABNORMAL HIGH (ref 0.3–0.9)
Total Bilirubin: 15.6 mg/dL — ABNORMAL HIGH (ref 0.3–1.2)
Total Protein: 5.9 g/dL — ABNORMAL LOW (ref 6.5–8.1)

## 2019-12-10 LAB — HEPATITIS PANEL, ACUTE
HCV Ab: REACTIVE — AB
Hep A IgM: NONREACTIVE
Hep B C IgM: NONREACTIVE
Hepatitis B Surface Ag: NONREACTIVE

## 2019-12-10 LAB — BASIC METABOLIC PANEL
Anion gap: 10 (ref 5–15)
BUN: 15 mg/dL (ref 8–23)
CO2: 26 mmol/L (ref 22–32)
Calcium: 8.4 mg/dL — ABNORMAL LOW (ref 8.9–10.3)
Chloride: 98 mmol/L (ref 98–111)
Creatinine, Ser: 0.76 mg/dL (ref 0.61–1.24)
GFR, Estimated: >60 mL/min (ref >60)
Glucose, Bld: 92 mg/dL (ref 70–99)
Potassium: 3.9 mmol/L (ref 3.5–5.1)
Sodium: 134 mmol/L — ABNORMAL LOW (ref 135–145)

## 2019-12-10 LAB — C-REACTIVE PROTEIN: CRP: 17.5 mg/dL — ABNORMAL HIGH (ref <1.0)

## 2019-12-10 LAB — HIV ANTIBODY (ROUTINE TESTING W REFLEX): HIV Screen 4th Generation wRfx: NONREACTIVE

## 2019-12-10 LAB — GAMMA GT: GGT: 168 U/L — ABNORMAL HIGH (ref 7–50)

## 2019-12-10 LAB — HEPATITIS B CORE ANTIBODY, TOTAL: Hep B Core Total Ab: NONREACTIVE

## 2019-12-10 LAB — TSH: TSH: 1.268 u[IU]/mL (ref 0.350–4.500)

## 2019-12-10 LAB — SEDIMENTATION RATE: Sed Rate: 45 mm/hr — ABNORMAL HIGH (ref 0–16)

## 2019-12-10 MED ORDER — MONTELUKAST SODIUM 10 MG PO TABS
10.0000 mg | ORAL_TABLET | Freq: Every day | ORAL | Status: DC
  Administered 2019-12-10: 10 mg via ORAL
  Filled 2019-12-10 (×2): qty 1

## 2019-12-10 MED ORDER — SODIUM CHLORIDE 0.9 % IV SOLN: INTRAVENOUS | Status: DC

## 2019-12-10 NOTE — Progress Notes (Signed)
PROGRESS NOTE    Ronnie [name]  IHK:[id number] DOB: 1955/12/28 DOA: 12/09/2019 PCP: Venia Carbon, MD   Brief Narrative:64 y.o. male with no significant past medical history was referred to the ER after patient had abnormal labs done as outpatient.  Patient states about 5 days ago patient started having intermittent swelling of his lips tongue throat ears and itching of the trunk and extremities off and on.  Patient had a similar symptom about 6 months ago which resolved without any intervention.  But at this time it is more protracted.  Given the symptoms patient had gone to the urgent care about 3 days ago and at that time patient was given antihistamine and Pepcid.  After taking the medication patient symptoms resolved.  Patient states that during that visit patient had blood drawn which showed abnormality and patient's primary care physician referred patient to the ER.  Patient denies drinking alcohol and using Tylenol or any recent outside travel.  ED Course: On exam in the ER patient appears jaundiced.  Labs are significant for total bilirubin of 18.6 alkaline phosphatase 230 lipase 28 AST 49 ALT 81 sodium 133 WBC 11.8.  CT abdomen pelvis and sonogram were unremarkable.  Acute hepatitis panel has been drawn and patient admitted for further observation.    Assessment & Plan:   Principal Problem:   Jaundice Active Problems:   BPH with obstruction/lower urinary tract symptoms   Elevated PSA   #1  Elevated LFTs/jaundice-unclear cause.  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 Alkaline phosphatase 204 from 230 Albumin 2.7 AST 49, ALT 64 Total bilirubin 15.6 down from 18.6 GGT 168 CRP 17.5 Complement panel sent CT abdomen no CT evidence of active process within the abdomen or pelvis, moderate severity degenerative changes L4-L5 and S1.  Enlarged prostate gland.  ultrasound normal right upper quadrant ultrasound Appreciate GI input  #2 hypokalemia resolved  #3 enlarged  prostate denies any symptoms  Estimated body mass index is 25.52 kg/m as calculated from the following:   Height as of this encounter: 5\' 11"  (1.803 m).   Weight as of this encounter: 83 kg.  DVT prophylaxis: Lovenox  code Status: Full code Family Communication: None at bedside  disposition Plan:  Status is: Observation  Dispo: The patient is from: Home              Anticipated d/c is to: Home              Anticipated d/c date is: 2 days              Patient currently is not medically stable to d/c.  Consultants:   GI  Procedures: None Antimicrobials: None  Subjective: Patient resting in bed reports that he is very tired did not get sleep continues to complain of itching jaundice denies any pain lives at home with his family his wife and 2 children  Objective: Vitals:   12/09/19 2206 12/09/19 2233 12/10/19 0203 12/10/19 0613  BP: 120/77  103/67 110/70  Pulse: (!) 102  95 96  Resp: 16  18 18   Temp: 99.3 F (37.4 C)  98.9 F (37.2 C) 98.9 F (37.2 C)  TempSrc: Oral  Oral Oral  SpO2: 100%  96% 96%  Weight:  83 kg    Height:  5\' 11"  (1.803 m)      Intake/Output Summary (Last 24 hours) at 12/10/2019 0958 Last data filed at 12/10/2019 0929 Gross per 24 hour  Intake 721 ml  Output 400 ml  Net 321 ml   Filed Weights   12/09/19 2233  Weight: 83 kg    Examination:  General exam: Appears calm and comfortable  Respiratory system: Clear to auscultation. Respiratory effort normal. Cardiovascular system: S1 & S2 heard, RRR. No JVD, murmurs, rubs, gallops or clicks. No pedal edema. Gastrointestinal system: Abdomen is nondistended, soft and nontender. No organomegaly or masses felt. Normal bowel sounds heard. Central nervous system: Alert and oriented. No focal neurological deficits. Extremities: Symmetric 5 x 5 power. Skin: No rashes, lesions or ulcers Psychiatry: Judgement and insight appear normal. Mood & affect appropriate.     Data Reviewed: I have personally  reviewed following labs and imaging studies  CBC: Recent Labs  Lab 12/09/19 1856 12/09/19 1919 12/10/19 0434  WBC 11.8*  --  8.9  NEUTROABS 8.8*  --  6.4  HGB 14.5 12.9* 13.0  HCT 43.5 38.0* 37.5*  MCV 87.9  --  86.6  PLT 317  --  921   Basic Metabolic Panel: Recent Labs  Lab 12/09/19 1856 12/09/19 1919 12/10/19 0434  NA 133* 138 134*  K 3.5 2.8* 3.9  CL 95* 103 98  CO2 24  --  26  GLUCOSE 100* 82 92  BUN 13 11 15   CREATININE 0.70 0.70 0.76  CALCIUM 9.2  --  8.4*   GFR: Estimated Creatinine Clearance: 100.7 mL/min (by C-G formula based on SCr of 0.76 mg/dL). Liver Function Tests: Recent Labs  Lab 12/09/19 1856 12/10/19 0434  AST 49* 49*  ALT 81* 64*  ALKPHOS 230* 204*  BILITOT 18.6* 15.6*  PROT 7.3 5.9*  ALBUMIN 3.2* 2.7*   Recent Labs  Lab 12/09/19 1856  LIPASE 28   No results for input(s): AMMONIA in the last 168 hours. Coagulation Profile: Recent Labs  Lab 12/09/19 1856  INR 1.3*   Cardiac Enzymes: No results for input(s): CKTOTAL, CKMB, CKMBINDEX, TROPONINI in the last 168 hours. BNP (last 3 results) No results for input(s): PROBNP in the last 8760 hours. HbA1C: No results for input(s): HGBA1C in the last 72 hours. CBG: No results for input(s): GLUCAP in the last 168 hours. Lipid Profile: No results for input(s): CHOL, HDL, LDLCALC, TRIG, CHOLHDL, LDLDIRECT in the last 72 hours. Thyroid Function Tests: Recent Labs    12/10/19 0533  TSH 1.268   Anemia Panel: No results for input(s): VITAMINB12, FOLATE, FERRITIN, TIBC, IRON, RETICCTPCT in the last 72 hours. Sepsis Labs: Recent Labs  Lab 12/09/19 1856  LATICACIDVEN 1.2    Recent Results (from the past 240 hour(s))  Respiratory Panel by RT PCR (Flu A&B, Covid) - Nasopharyngeal Swab     Status: None   Collection Time: 12/09/19  7:55 PM   Specimen: Nasopharyngeal Swab  Result Value Ref Range Status   SARS Coronavirus 2 by RT PCR NEGATIVE NEGATIVE Final    Comment: (NOTE) SARS-CoV-2  target nucleic acids are NOT DETECTED.  The SARS-CoV-2 RNA is generally detectable in upper respiratoy specimens during the acute phase of infection. The lowest concentration of SARS-CoV-2 viral copies this assay can detect is 131 copies/mL. A negative result does not preclude SARS-Cov-2 infection and should not be used as the sole basis for treatment or other patient management decisions. A negative result may occur with  improper specimen collection/handling, submission of specimen other than nasopharyngeal swab, presence of viral mutation(s) within the areas targeted by this assay, and inadequate number of viral copies (<131 copies/mL). A negative result must be combined with clinical observations, patient history, and epidemiological  information. The expected result is Negative.  Fact Sheet for Patients:  PinkCheek.be  Fact Sheet for Healthcare Providers:  GravelBags.it  This test is no t yet approved or cleared by the [place] FDA and  has been authorized for detection and/or diagnosis of SARS-CoV-2 by FDA under an Emergency Use Authorization (EUA). This EUA will remain  in effect (meaning this test can be used) for the duration of the COVID-19 declaration under Section 564(b)(1) of the Act, 21 U.S.C. section 360bbb-3(b)(1), unless the authorization is terminated or revoked sooner.     Influenza A by PCR NEGATIVE NEGATIVE Final   Influenza B by PCR NEGATIVE NEGATIVE Final    Comment: (NOTE) The Xpert Xpress SARS-CoV-2/FLU/RSV assay is intended as an aid in  the diagnosis of influenza from Nasopharyngeal swab specimens and  should not be used as a sole basis for treatment. Nasal washings and  aspirates are unacceptable for Xpert Xpress SARS-CoV-2/FLU/RSV  testing.  Fact Sheet for Patients: PinkCheek.be  Fact Sheet for Healthcare  Providers: GravelBags.it  This test is not yet approved or cleared by the [place] FDA and  has been authorized for detection and/or diagnosis of SARS-CoV-2 by  FDA under an Emergency Use Authorization (EUA). This EUA will remain  in effect (meaning this test can be used) for the duration of the  Covid-19 declaration under Section 564(b)(1) of the Act, 21  U.S.C. section 360bbb-3(b)(1), unless the authorization is  terminated or revoked. Performed at [facility], [place] [address] [place], [place] [address]          Radiology Studies: CT ABDOMEN PELVIS W CONTRAST  Result Date: 12/09/2019 CLINICAL DATA:  Abdominal pain. EXAM: CT ABDOMEN AND PELVIS WITH CONTRAST TECHNIQUE: Multidetector CT imaging of the abdomen and pelvis was performed using the standard protocol following bolus administration of intravenous contrast. CONTRAST:  160mL OMNIPAQUE IOHEXOL 300 MG/ML  SOLN COMPARISON:  June 10, 2016 FINDINGS: Lower chest: No acute abnormality. Hepatobiliary: No focal liver abnormality is seen. No gallstones, gallbladder wall thickening, or biliary dilatation. Pancreas: Unremarkable. No pancreatic ductal dilatation or surrounding inflammatory changes. Spleen: Normal in size without focal abnormality. Adrenals/Urinary Tract: Adrenal glands are unremarkable. Kidneys are normal, without renal calculi, focal lesion, or hydronephrosis. Bladder is unremarkable. Stomach/Bowel: Stomach is within normal limits. Appendix appears normal. No evidence of bowel wall thickening, distention, or inflammatory changes. Vascular/Lymphatic: No significant vascular findings are present. No enlarged abdominal or pelvic lymph nodes. Reproductive: The prostate gland is moderately enlarged. Other: No abdominal wall hernia or abnormality. No abdominopelvic ascites. Musculoskeletal: Moderate severity degenerative changes are seen at the levels of L4-L5 and L5-S1. IMPRESSION:  1. No CT evidence of acute or active process within the abdomen or pelvis. 2. Moderate severity degenerative changes at the levels of L4-L5 and L5-S1. 3. Enlarged prostate gland. Electronically Signed   By: Virgina Norfolk M.D.   On: 12/09/2019 20:04   US Abdomen Limited RUQ (LIVER/GB)  Result Date: 12/09/2019 CLINICAL DATA:  Hyperbilirubinemia EXAM: ULTRASOUND ABDOMEN LIMITED RIGHT UPPER QUADRANT COMPARISON:  CT 12/09/2019 FINDINGS: Gallbladder: No gallstones or wall thickening visualized. No sonographic Murphy sign noted by sonographer. Common bile duct: Diameter: Normal caliber, 4 mm Liver: No focal lesion identified. Within normal limits in parenchymal echogenicity. Portal vein is patent on color Doppler imaging with normal direction of blood flow towards the liver. Other: None. IMPRESSION: Normal right upper quadrant ultrasound. Electronically Signed   By: Rolm Baptise M.D.   On: 12/09/2019 20:27  Scheduled Meds: Continuous Infusions: . sodium chloride 150 mL/hr at 12/10/19 0853     LOS: 0 days     Georgette Shell, MD  12/10/2019, 9:58 AM

## 2019-12-10 NOTE — Consult Note (Signed)
Referring Provider: Landis Gandy  Primary Care Physician:  Venia Carbon, MD Primary Gastroenterologist:  Althia Forts   Reason for Consultation:  Jaundice, elevated LFTs   HPI: Ronnie [name] is a 64 y.o. male with a past medical history of back pain s/p lumbar surgery in 2009. 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  Specific triggers/allergens have been identified.  He developed a decreased appetite with profound fatigue 1 week ago.  He had recurrence of his angioedema type flare approximately 4 days ago described as swelling in his ears which radiated down into his throat.  His throat felt swollen and was painful.  No associated shortness of breath or respiratory distress.  He then developed swelling to his feet.  He went to the urgent care center on 12/06/2019 for further evaluation.  He was prescribed famotidine and Xyzal which she took 1 dose of each without improvement.  Laboratory studies were collected and he received a phone call from the urgent care on 10/19 stating that his liver enzymes were elevated.  His wife also noticed he appeared jaundiced.  He contacted his primary care physician who sent him directly to [facility] ED on 12/09/2019 for further evaluation.  Labs in the ED showed an elevated alk phos 230.  AST 49.  ALT 81.  Total bili 18.6.  Hepatitis C antibody positive.  Hepatitis B surface antigen and hepatitis B core IgM nonreactive.  Hepatitis A IgM nonreactive.  WBC 11.8.  Hemoglobin 14.5.  An abdominal/pelvic CAT scan with contrast normal liver without evidence of gallstones or gallbladder wall thickening.  No biliary dilatation.  No acute process within the abdomen or pelvis.   He complains of persistent fatigue and decreased appetite for the past week.  Slight nausea without any vomiting.  No upper or lower abdominal  pain.  No dysphagia, odynophagia or heartburn.  He reports losing 5 pounds over the past week. Intermittent sweats over the past week.  No fevers.  His urine has been darker in color for the past week.  He typically passed a normal brown formed stool daily but for the past week his stools were described as an Soil scientist.  No rectal bleeding or melena.  He denies taking any antibiotics for least the past 6 months.  He infrequently takes Advil or Motrin.  He drinks 3 beers yearly.  No history of drug use.  I discussed his hepatitis C antibody was positive.  He denies any risk factors for hepatitis C.  He is in a monogamous relationship with his wife.  No family history of liver disease, biliary or liver cancer. He underwent a colonoscopy by Dr. Earlean Shawl in 2017 and one 70m polyp was removed from the cecum.  He was advised to repeat a colonoscopy in 5 years.  ED course: Sodium 133.  Potassium 3.5.  Glucose 100.  BUN 13.  Creatinine 0.70.  Alk phos 230.  Albumin 3.2.  Lipase 28.  AST 49.  ALT 81.  Total bili 18.6.  WBC 11.8.  Hemoglobin 14.5.  Hematocrit 43.5.  Platelet 317.  INR 1.3.  Acetaminophen level < 10.  Alcohol < 10. Hepatitis A IgM antibody nonreactive.  Hepatitis B surface antigen nonreactive.  Hepatitis B core IgM nonreactive.  Hepatitis C antibody positive.  HIV not done.  SARS coronavirus 2 negative.  Influenza A &  B negative.    Abdominal/pelvic CT with contrast 12/09/2019: No CT evidence of acute or active process within the abdomen or pelvis.  Hepatobiliary: No focal liver abnormality is seen. No gallstones, gallbladder wall thickening, or biliary dilatation. Pancreas: Unremarkable. No pancreatic ductal dilatation or surrounding inflammatory changes Moderate severity degenerative changes at the levels of L4-L5 and L5-S1. Enlarged prostate gland.   Past Medical History:  Diagnosis Date  . Back pain 2009   with radiculopathy. Fell off roof bulging L4/L5 and a comp fx L5/S1  . Ear lump    . Numbness and tingling of foot    Right  . 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 BACK SURGERY    . Bulging disc L4/L5, comp fx L5/S1  2009   Post falling off roof    Prior to Admission medications   Not on File    Current Facility-Administered Medications  Medication Dose Route Frequency Provider Last Rate Last Admin  . 0.9 %  sodium chloride infusion   Intravenous Continuous Georgette Shell, MD 150 mL/hr at 12/10/19 0853 New Bag at 12/10/19 0853    Allergies as of 12/09/2019  . (No Known Allergies)    Family History  Problem Relation Age of Onset  . Cancer Mother        Liver  . Heart disease Father        rheumatic heart disease, no CAD  . Heart disease Paternal Uncle   . Heart disease Paternal Grandfather     Social History   Socioeconomic History  . Marital status: Married    Spouse name: Not on file  . Number of children: 2  . Years of education: Not on file  . Highest education level: Not on file  Occupational History  . 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 Smoking status: Never Smoker  . Smokeless tobacco: Never Used  Substance and Sexual Activity  . Alcohol use: Yes    Comment: rare beer  . Drug use: No  . Sexual activity: Not on file  Other Topics Concern  . Not on file  Social History Narrative   No regular exercise.   Social Determinants of Health   Financial Resource Strain:   . Difficulty of Paying Living Expenses: Not on file  Food Insecurity:   . Worried About Charity fundraiser in the Last Year: Not on file  . Ran Out of Food in the Last Year: Not on file  Transportation Needs:   . Lack of Transportation (Medical): Not on file  . Lack of Transportation (Non-Medical): Not on file  Physical Activity:   . Days of Exercise per Week: Not on file  . Minutes of Exercise per Session: Not on file  Stress:   . Feeling of Stress  : Not on file  Social Connections:   . Frequency of Communication with Friends and Family: Not on file  . Frequency of Social Gatherings with Friends and Family: Not on file  . Attends Religious Services: Not on file  . Active Member of Clubs or Organizations: Not on file  . Attends Archivist Meetings: Not on file  . Marital Status: Not on file  Intimate Partner Violence:   . Fear of Current or Ex-Partner: Not on file  . Emotionally Abused: Not on file  . Physically Abused: Not on file  . Sexually Abused:  Not on file    Review of Systems: Gen: See HPI.  CV: Denies chest pain, palpitations or edema. Resp: Denies cough, shortness of breath of hemoptysis.  GI: See HPI. Stools ivory color x 7 days. Denies heartburn, dysphagia, stomach or lower abdominal pain. No diarrhea or constipation. No rectal bleeding or melena.   GU : Denies urinary burning, blood in urine, increased urinary frequency or incontinence. MS: + muscle weakness.  Derm: Denies rash, itchiness, skin lesions or unhealing ulcers. Psych: Denies depression or anxiety.  Heme: Denies easy bruising, bleeding. Neuro:  Denies headaches, dizziness or paresthesias. Endo:  Denies any problems with DM, thyroid or adrenal function.  Physical Exam: Vital signs in last 24 hours: Temp:  [98.1 F (36.7 C)-99.3 F (37.4 C)] 98.9 F (37.2 C) (10/21 5790) Pulse Rate:  [91-102] 96 (10/21 0613) Resp:  [16-18] 18 (10/21 0613) BP: (103-124)/(67-77) 110/70 (10/21 0613) SpO2:  [95 %-100 %] 96 % (10/21 3833) Weight:  [83 kg] 83 kg (10/20 2233) Last BM Date: 12/08/19 General:  Alert anxious appearing 64 year old male in NAD.  Head:  Normocephalic and atraumatic. Eyes:  Significant scleral icterus. Conjunctiva pink. Ears:  Normal auditory acuity. Nose:  No deformity, discharge or lesions. Mouth:  Dentition intact. No ulcers or lesions. Buccal mucosa jaundiced.  Neck:  Supple. No lymphadenopathy or thyromegaly.  Lungs: Sounds  clear throughout. Heart: Regular rate and rhythm. No murmurs. Abdomen: Large abdomen, soft. Nondistended. Nontender. No HSM. + BS x 4 quads. Rectal: Deferred. Musculoskeletal:  Symmetrical without gross deformities.  Pulses:  Normal pulses noted. Extremities:  Without clubbing or edema. Neurologic:  Alert and  oriented x4. No focal deficits. No asterixis.  Skin:  Moderately jaundiced.  Psych:  Alert and cooperative. Normal mood and affect.  Intake/Output from previous day: 10/20 0701 - 10/21 0700 In: 721 [P.O.:721] Out: 0  Intake/Output this shift: Total I/O In: 0  Out: 400 [Urine:400]  Lab Results: Recent Labs    12/09/19 1856 12/09/19 1919 12/10/19 0434  WBC 11.8*  --  8.9  HGB 14.5 12.9* 13.0  HCT 43.5 38.0* 37.5*  PLT 317  --  273   BMET Recent Labs    12/09/19 1856 12/09/19 1919 12/10/19 0434  NA 133* 138 134*  K 3.5 2.8* 3.9  CL 95* 103 98  CO2 24  --  26  GLUCOSE 100* 82 92  BUN 13 11 15   CREATININE 0.70 0.70 0.76  CALCIUM 9.2  --  8.4*   LFT Recent Labs    12/10/19 0434  PROT 5.9*  ALBUMIN 2.7*  AST 49*  ALT 64*  ALKPHOS 204*  BILITOT 15.6*  BILIDIR 9.7*  IBILI 5.9*   PT/INR Recent Labs    12/09/19 1856  LABPROT 15.3*  INR 1.3*   Hepatitis Panel Recent Labs    12/09/19 1914  HEPBSAG NON REACTIVE  HCVAB Reactive*  HEPAIGM NON REACTIVE  HEPBIGM NON REACTIVE      Studies/Results: CT ABDOMEN PELVIS W CONTRAST  Result Date: 12/09/2019 CLINICAL DATA:  Abdominal pain. EXAM: CT ABDOMEN AND PELVIS WITH CONTRAST TECHNIQUE: Multidetector CT imaging of the abdomen and pelvis was performed using the standard protocol following bolus administration of intravenous contrast. CONTRAST:  139m OMNIPAQUE IOHEXOL 300 MG/ML  SOLN COMPARISON:  June 10, 2016 FINDINGS: Lower chest: No acute abnormality. Hepatobiliary: No focal liver abnormality is seen. No gallstones, gallbladder wall thickening, or biliary dilatation. Pancreas: Unremarkable. No  pancreatic ductal dilatation or surrounding inflammatory changes. Spleen: Normal in  size without focal abnormality. Adrenals/Urinary Tract: Adrenal glands are unremarkable. Kidneys are normal, without renal calculi, focal lesion, or hydronephrosis. Bladder is unremarkable. Stomach/Bowel: Stomach is within normal limits. Appendix appears normal. No evidence of bowel wall thickening, distention, or inflammatory changes. Vascular/Lymphatic: No significant vascular findings are present. No enlarged abdominal or pelvic lymph nodes. Reproductive: The prostate gland is moderately enlarged. Other: No abdominal wall hernia or abnormality. No abdominopelvic ascites. Musculoskeletal: Moderate severity degenerative changes are seen at the levels of L4-L5 and L5-S1. IMPRESSION: 1. No CT evidence of acute or active process within the abdomen or pelvis. 2. Moderate severity degenerative changes at the levels of L4-L5 and L5-S1. 3. Enlarged prostate gland. Electronically Signed   By: Virgina Norfolk M.D.   On: 12/09/2019 20:04   US Abdomen Limited RUQ (LIVER/GB)  Result Date: 12/09/2019 CLINICAL DATA:  Hyperbilirubinemia EXAM: ULTRASOUND ABDOMEN LIMITED RIGHT UPPER QUADRANT COMPARISON:  CT 12/09/2019 FINDINGS: Gallbladder: No gallstones or wall thickening visualized. No sonographic Murphy sign noted by sonographer. Common bile duct: Diameter: Normal caliber, 4 mm Liver: No focal lesion identified. Within normal limits in parenchymal echogenicity. Portal vein is patent on color Doppler imaging with normal direction of blood flow towards the liver. Other: None. IMPRESSION: Normal right upper quadrant ultrasound. Electronically Signed   By: Rolm Baptise M.D.   On: 12/09/2019 20:27    IMPRESSION/PLAN:  25. 64 year old male with elevated T. Bili, LFTs with jaundice. Direct bili > indirect. Hep C antibody +. Patient reports acholic stools x 1 week concerning for biliary obstruction. CTAP without evidence of hepatoma or biliary  obstruction.  -Hep C RNA quant, HIV, IgG, ANA, SMA, AMA, ceruloplasmin, CMV IgM, EBV IgM -Consider abdominal MRI/MRCP, further recommendations per Dr. 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 History of colon polyps -Next colonoscopy due 12/2020    [name]  12/10/2019, 9:58 AM

## 2019-12-11 DIAGNOSIS — R17 Unspecified jaundice: Secondary | ICD-10-CM

## 2019-12-11 DIAGNOSIS — B259 Cytomegaloviral disease, unspecified: Secondary | ICD-10-CM | POA: Diagnosis not present

## 2019-12-11 LAB — C1 ESTERASE INHIBITOR: C1INH SerPl-mCnc: 37 mg/dL (ref 21–39)

## 2019-12-11 LAB — ANTI-SMOOTH MUSCLE ANTIBODY, IGG: F-Actin IgG: 7 Units (ref 0–19)

## 2019-12-11 LAB — PROTIME-INR
INR: 1.2 (ref 0.8–1.2)
Prothrombin Time: 14.5 seconds (ref 11.4–15.2)

## 2019-12-11 LAB — C3 COMPLEMENT: C3 Complement: 170 mg/dL — ABNORMAL HIGH (ref 82–167)

## 2019-12-11 LAB — COMPREHENSIVE METABOLIC PANEL
ALT: 73 U/L — ABNORMAL HIGH (ref 0–44)
AST: 58 U/L — ABNORMAL HIGH (ref 15–41)
Albumin: 2.6 g/dL — ABNORMAL LOW (ref 3.5–5.0)
Alkaline Phosphatase: 212 U/L — ABNORMAL HIGH (ref 38–126)
Anion gap: 8 (ref 5–15)
BUN: 11 mg/dL (ref 8–23)
CO2: 23 mmol/L (ref 22–32)
Calcium: 8.2 mg/dL — ABNORMAL LOW (ref 8.9–10.3)
Chloride: 103 mmol/L (ref 98–111)
Creatinine, Ser: 0.68 mg/dL (ref 0.61–1.24)
GFR, Estimated: >60 mL/min (ref >60)
Glucose, Bld: 107 mg/dL — ABNORMAL HIGH (ref 70–99)
Potassium: 4.1 mmol/L (ref 3.5–5.1)
Sodium: 134 mmol/L — ABNORMAL LOW (ref 135–145)
Total Bilirubin: 15.6 mg/dL — ABNORMAL HIGH (ref 0.3–1.2)
Total Protein: 6.1 g/dL — ABNORMAL LOW (ref 6.5–8.1)

## 2019-12-11 LAB — APTT: aPTT: 29 seconds (ref 24–36)

## 2019-12-11 LAB — HCV RNA QUANT RFLX ULTRA OR GENOTYP
HCV RNA Qnt(log copy/mL): UNDETERMINED log10 IU/mL
HepC Qn: NOT DETECTED IU/mL

## 2019-12-11 LAB — EPSTEIN-BARR VIRUS VCA, IGM: EBV VCA IgM: <36 U/mL (ref 0.0–35.9)

## 2019-12-11 LAB — IGG: IgG (Immunoglobin G), Serum: 798 mg/dL (ref 603–1613)

## 2019-12-11 LAB — HEPATITIS B SURFACE ANTIBODY, QUANTITATIVE: Hep B S AB Quant (Post): <3.1 m[IU]/mL — ABNORMAL LOW (ref >9.9)

## 2019-12-11 LAB — MITOCHONDRIAL ANTIBODIES: Mitochondrial M2 Ab, IgG: <20 Units (ref 0.0–20.0)

## 2019-12-11 LAB — C4 COMPLEMENT: Complement C4, Body Fluid: 18 mg/dL (ref 12–38)

## 2019-12-11 LAB — ANA: Anti Nuclear Antibody (ANA): NEGATIVE

## 2019-12-11 LAB — CMV IGM: CMV IgM: 55.7 AU/mL — ABNORMAL HIGH (ref 0.0–29.9)

## 2019-12-11 LAB — CERULOPLASMIN: Ceruloplasmin: 32.2 mg/dL — ABNORMAL HIGH (ref 16.0–31.0)

## 2019-12-11 NOTE — Progress Notes (Addendum)
[facility] Progress Note  CC:  Jaundice, elevated LFTs  Subjective: No N/V or abdominal pain. He passed a BM this morning and he took a picture of the stool which appeared to be  a beige/grayish formed stool. No white stool. No blood. No complaints this am. His son is at the bedside.    Objective:  Vital signs in last 24 hours: Temp:  [99.5 F (37.5 C)-100 F (37.8 C)] 99.6 F (37.6 C) (10/22 0629) Pulse Rate:  [90-93] 90 (10/22 0629) Resp:  [18] 18 (10/22 0629) BP: (104-115)/(68-74) 104/70 (10/22 0629) SpO2:  [95 %-96 %] 95 % (10/22 0629) Last BM Date: 12/08/19   General:  Alert 64 year old male appears anxious in NAD.  Eyes: Moderate scleral icterus.  Heart: RRR, no murmur.  Pulm:  Breath sounds clear throughout.  Abdomen: Soft, nontender. No masses or HSM. + BS x 4 quads.  Extremities:  Without edema. Neurologic:  Alert and  oriented x4. Grossly normal neurologically. No asterixis.  Psych:  Alert and cooperative. Normal mood and affect. Skin: Jaundiced.  Intake/Output from previous day: 10/21 0701 - 10/22 0700 In: 3822.7 [P.O.:770; I.V.:3052.7] Out: 2725 [Urine:2725] Intake/Output this shift: No intake/output data recorded.  Lab Results: Recent Labs    12/09/19 1856 12/09/19 1919 12/10/19 0434  WBC 11.8*  --  8.9  HGB 14.5 12.9* 13.0  HCT 43.5 38.0* 37.5*  PLT 317  --  273   BMET Recent Labs    12/09/19 1856 12/09/19 1856 12/09/19 1919 12/10/19 0434 12/11/19 0421  NA 133*   < > 138 134* 134*  K 3.5   < > 2.8* 3.9 4.1  CL 95*   < > 103 98 103  CO2 24  --   --  26 23  GLUCOSE 100*   < > 82 92 107*  BUN 13   < > 11 15 11   CREATININE 0.70   < > 0.70 0.76 0.68  CALCIUM 9.2  --   --  8.4* 8.2*   < > = values in this interval not displayed.   LFT Recent Labs    12/10/19 0434 12/10/19 0434 12/11/19 0421  PROT 5.9*   < > 6.1*  ALBUMIN 2.7*   < > 2.6*  AST 49*   < > 58*  ALT 64*   < > 73*  ALKPHOS 204*   < > 212*  BILITOT  15.6*   < > 15.6*  BILIDIR 9.7*  --   --   IBILI 5.9*  --   --    < > = values in this interval not displayed.   PT/INR Recent Labs    12/09/19 1856 12/11/19 0421  LABPROT 15.3* 14.5  INR 1.3* 1.2   Hepatitis Panel Recent Labs    12/09/19 1914  HEPBSAG NON REACTIVE  HCVAB Reactive*  HEPAIGM NON REACTIVE  HEPBIGM NON REACTIVE    CT ABDOMEN PELVIS W CONTRAST  Result Date: 12/09/2019 CLINICAL DATA:  Abdominal pain. EXAM: CT ABDOMEN AND PELVIS WITH CONTRAST TECHNIQUE: Multidetector CT imaging of the abdomen and pelvis was performed using the standard protocol following bolus administration of intravenous contrast. CONTRAST:  15m OMNIPAQUE IOHEXOL 300 MG/ML  SOLN COMPARISON:  June 10, 2016 FINDINGS: Lower chest: No acute abnormality. Hepatobiliary: No focal liver abnormality is seen. No gallstones, gallbladder wall thickening, or biliary dilatation. Pancreas: Unremarkable. No pancreatic ductal dilatation or surrounding inflammatory changes. Spleen: Normal in size without focal abnormality. Adrenals/Urinary Tract: Adrenal glands are  unremarkable. Kidneys are normal, without renal calculi, focal lesion, or hydronephrosis. Bladder is unremarkable. Stomach/Bowel: Stomach is within normal limits. Appendix appears normal. No evidence of bowel wall thickening, distention, or inflammatory changes. Vascular/Lymphatic: No significant vascular findings are present. No enlarged abdominal or pelvic lymph nodes. Reproductive: The prostate gland is moderately enlarged. Other: No abdominal wall hernia or abnormality. No abdominopelvic ascites. Musculoskeletal: Moderate severity degenerative changes are seen at the levels of L4-L5 and L5-S1. IMPRESSION: 1. No CT evidence of acute or active process within the abdomen or pelvis. 2. Moderate severity degenerative changes at the levels of L4-L5 and L5-S1. 3. Enlarged prostate gland. Electronically Signed   By: Virgina Norfolk M.D.   On: 12/09/2019 20:04   US  Abdomen Limited RUQ (LIVER/GB)  Result Date: 12/09/2019 CLINICAL DATA:  Hyperbilirubinemia EXAM: ULTRASOUND ABDOMEN LIMITED RIGHT UPPER QUADRANT COMPARISON:  CT 12/09/2019 FINDINGS: Gallbladder: No gallstones or wall thickening visualized. No sonographic Murphy sign noted by sonographer. Common bile duct: Diameter: Normal caliber, 4 mm Liver: No focal lesion identified. Within normal limits in parenchymal echogenicity. Portal vein is patent on color Doppler imaging with normal direction of blood flow towards the liver. Other: None. IMPRESSION: Normal right upper quadrant ultrasound. Electronically Signed   By: Rolm Baptise M.D.   On: 12/09/2019 20:27    Assessment / Plan:  55. 64 year old male with elevated T. Bili, LFTs with jaundice. Direct bili > indirect. Hep C antibody + and CMV IgM +.  Patient reports acholic stools x 1 week concerning for biliary obstruction. Today he passed a light beige/gray solid stool. CTAP without evidence of hepatoma or biliary obstruction.  HIV negative. IgG 798.  Ceruloplasmin 32.2. Hep C RNA quant, ANA, SMA, AMA and EBV IgM pending. Alk phos 204 -> 212. AST 49 -> 58. ALT 64 -> 73. T. Bili 15.6. INR 1.2.  Supportive Care  Diet as tolerated Await above pending laboratory results Further recommendations per Dr. 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 C3, C4 and C 1 esterase inhibitor levels pending  3. History of colon polyps -Next colonoscopy due 12/2020     Principal Problem:   Jaundice Active Problems:   BPH with obstruction/lower urinary tract symptoms   Elevated PSA     LOS: 1 day   [name]  12/11/2019, 9:39 AM   I have discussed the case with the PA, and that is the plan I formulated. I personally interviewed and examined the patient this evening (his son was present for the entire visit)  CC: Nonobstructive jaundice  Extensive testing thus far negative for autoimmune liver disease, had a positive  hepatitis C antibody but negative viral load. He has a curious history over about the last 2 years of what sounds like intermittent angioedema, though seems unlikely to be related to this acute condition.  The only positive result so far is a CMV IgM antibody. He has been feeling fatigued the last few weeks, which could be from CMV, but it would be very unusual for that to cause this degree of jaundice in an immune competent patient. His HIV test was also negative.  He and his son had many questions which I took time to answer. He is understandably concerned, but I reassured them that his INR stable and thus liver synthetic function is good. His bilirubin is also stable today.  I think we will watch this for the weekend in hopes that he continues to improve. If not, possibly liver  biopsy early next week.   35 minutes were spent on this encounter (including chart review, history/exam, counseling/coordination of care, and documentation)   [name] Office: [phone]

## 2019-12-11 NOTE — Progress Notes (Signed)
PROGRESS NOTE    Ronnie [name]  WUJ:[id number] DOB: 1955-06-28 DOA: 12/09/2019 PCP: Venia Carbon, MD   Brief Narrative:64 y.o. male with no significant past medical history was referred to the ER after patient had abnormal labs done as outpatient.  Patient states about 5 days ago patient started having intermittent swelling of his lips tongue throat ears and itching of the trunk and extremities off and on.  Patient had a similar symptom about 6 months ago which resolved without any intervention.  But at this time it is more protracted.  Given the symptoms patient had gone to the urgent care about 3 days ago and at that time patient was given antihistamine and Pepcid.  After taking the medication patient symptoms resolved.  Patient states that during that visit patient had blood drawn which showed abnormality and patient's primary care physician referred patient to the ER.  Patient denies drinking alcohol and using Tylenol or any recent outside travel.  ED Course: On exam in the ER patient appears jaundiced.  Labs are significant for total bilirubin of 18.6 alkaline phosphatase 230 lipase 28 AST 49 ALT 81 sodium 133 WBC 11.8.  CT abdomen pelvis and sonogram were unremarkable.  Acute hepatitis panel has been drawn and patient admitted for further observation.    Assessment & Plan:   Principal Problem:   Jaundice Active Problems:   BPH with obstruction/lower urinary tract symptoms   Elevated PSA   #1  Elevated LFTs/jaundice-unclear cause.  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 Patient denies any risk factors for hepatitis C. Alkaline phosphatase remains elevated at 212 Albumin 2.6 AST 58, ALT 73 Total bilirubin still at 15.6 down from 18.6 GGT 168 CRP 17.5 Ceruloplasmin 32.2 IgG 798 normal Complement panel sent ANA negative, antimitochondrial antibody less than 20 Complement levels normal I am not sure what to make of this hepatitis B post? HIV nonreactive CT abdomen  no CT evidence of active process within the abdomen or pelvis, moderate severity degenerative changes L4-L5 and S1.  Enlarged prostate gland.  ultrasound normal right upper quadrant ultrasound Appreciate GI input await further recommendation question if he needs biopsy of liver  #2 hypokalemia resolved  #3 enlarged prostate denies any symptoms  Estimated body mass index is 25.52 kg/m as calculated from the following:   Height as of this encounter: 5\' 11"  (1.803 m).   Weight as of this encounter: 83 kg.  DVT prophylaxis: Lovenox  code Status: Full code Family Communication: None at bedside  disposition Plan:  Status is: Observation  Dispo: The patient is from: Home              Anticipated d/c is to: Home              Anticipated d/c date is: 2 days              Patient currently is not medically stable to d/c.  Consultants:   GI  Procedures: None Antimicrobials: None  Subjective: Sent by the bedside patient reports having a bowel movement beige in color Continues to have itching and jaundice  Objective: Vitals:   12/10/19 0613 12/10/19 1326 12/10/19 2215 12/11/19 0629  BP: 110/70 115/74 106/68 104/70  Pulse: 96 91 93 90  Resp: 18  18 18   Temp: 98.9 F (37.2 C) 99.5 F (37.5 C) 100 F (37.8 C) 99.6 F (37.6 C)  TempSrc: Oral Oral Oral Oral  SpO2: 96% 96% 95% 95%  Weight:      Height:  Intake/Output Summary (Last 24 hours) at 12/11/2019 1305 Last data filed at 12/11/2019 1204 Gross per 24 hour  Intake 4612.62 ml  Output 3225 ml  Net 1387.62 ml   Filed Weights   12/09/19 2233  Weight: 83 kg    Examination:  General exam: Appears calm and comfortable  Respiratory system: Clear to auscultation. Respiratory effort normal. Cardiovascular system: S1 & S2 heard, RRR. No JVD, murmurs, rubs, gallops or clicks. No pedal edema. Gastrointestinal system: Abdomen is nondistended, soft and nontender. No organomegaly or masses felt. Normal bowel sounds  heard. Central nervous system: Alert and oriented. No focal neurological deficits. Extremities: Symmetric 5 x 5 power. Skin: No rashes, lesions or ulcers Psychiatry: Judgement and insight appear normal. Mood & affect appropriate.     Data Reviewed: I have personally reviewed following labs and imaging studies  CBC: Recent Labs  Lab 12/09/19 1856 12/09/19 1919 12/10/19 0434  WBC 11.8*  --  8.9  NEUTROABS 8.8*  --  6.4  HGB 14.5 12.9* 13.0  HCT 43.5 38.0* 37.5*  MCV 87.9  --  86.6  PLT 317  --  630   Basic Metabolic Panel: Recent Labs  Lab 12/09/19 1856 12/09/19 1919 12/10/19 0434 12/11/19 0421  NA 133* 138 134* 134*  K 3.5 2.8* 3.9 4.1  CL 95* 103 98 103  CO2 24  --  26 23  GLUCOSE 100* 82 92 107*  BUN 13 11 15 11   CREATININE 0.70 0.70 0.76 0.68  CALCIUM 9.2  --  8.4* 8.2*   GFR: Estimated Creatinine Clearance: 100.7 mL/min (by C-G formula based on SCr of 0.68 mg/dL). Liver Function Tests: Recent Labs  Lab 12/09/19 1856 12/10/19 0434 12/11/19 0421  AST 49* 49* 58*  ALT 81* 64* 73*  ALKPHOS 230* 204* 212*  BILITOT 18.6* 15.6* 15.6*  PROT 7.3 5.9* 6.1*  ALBUMIN 3.2* 2.7* 2.6*   Recent Labs  Lab 12/09/19 1856  LIPASE 28   No results for input(s): AMMONIA in the last 168 hours. Coagulation Profile: Recent Labs  Lab 12/09/19 1856 12/11/19 0421  INR 1.3* 1.2   Cardiac Enzymes: No results for input(s): CKTOTAL, CKMB, CKMBINDEX, TROPONINI in the last 168 hours. BNP (last 3 results) No results for input(s): PROBNP in the last 8760 hours. HbA1C: No results for input(s): HGBA1C in the last 72 hours. CBG: No results for input(s): GLUCAP in the last 168 hours. Lipid Profile: No results for input(s): CHOL, HDL, LDLCALC, TRIG, CHOLHDL, LDLDIRECT in the last 72 hours. Thyroid Function Tests: Recent Labs    12/10/19 0533  TSH 1.268   Anemia Panel: No results for input(s): VITAMINB12, FOLATE, FERRITIN, TIBC, IRON, RETICCTPCT in the last 72  hours. Sepsis Labs: Recent Labs  Lab 12/09/19 1856  LATICACIDVEN 1.2    Recent Results (from the past 240 hour(s))  Respiratory Panel by RT PCR (Flu A&B, Covid) - Nasopharyngeal Swab     Status: None   Collection Time: 12/09/19  7:55 PM   Specimen: Nasopharyngeal Swab  Result Value Ref Range Status   SARS Coronavirus 2 by RT PCR NEGATIVE NEGATIVE Final    Comment: (NOTE) SARS-CoV-2 target nucleic acids are NOT DETECTED.  The SARS-CoV-2 RNA is generally detectable in upper respiratoy specimens during the acute phase of infection. The lowest concentration of SARS-CoV-2 viral copies this assay can detect is 131 copies/mL. A negative result does not preclude SARS-Cov-2 infection and should not be used as the sole basis for treatment or other patient management decisions. A  negative result may occur with  improper specimen collection/handling, submission of specimen other than nasopharyngeal swab, presence of viral mutation(s) within the areas targeted by this assay, and inadequate number of viral copies (<131 copies/mL). A negative result must be combined with clinical observations, patient history, and epidemiological information. The expected result is Negative.  Fact Sheet for Patients:  PinkCheek.be  Fact Sheet for Healthcare Providers:  GravelBags.it  This test is no t yet approved or cleared by the [place] FDA and  has been authorized for detection and/or diagnosis of SARS-CoV-2 by FDA under an Emergency Use Authorization (EUA). This EUA will remain  in effect (meaning this test can be used) for the duration of the COVID-19 declaration under Section 564(b)(1) of the Act, 21 U.S.C. section 360bbb-3(b)(1), unless the authorization is terminated or revoked sooner.     Influenza A by PCR NEGATIVE NEGATIVE Final   Influenza B by PCR NEGATIVE NEGATIVE Final    Comment: (NOTE) The Xpert Xpress SARS-CoV-2/FLU/RSV  assay is intended as an aid in  the diagnosis of influenza from Nasopharyngeal swab specimens and  should not be used as a sole basis for treatment. Nasal washings and  aspirates are unacceptable for Xpert Xpress SARS-CoV-2/FLU/RSV  testing.  Fact Sheet for Patients: PinkCheek.be  Fact Sheet for Healthcare Providers: GravelBags.it  This test is not yet approved or cleared by the [place] FDA and  has been authorized for detection and/or diagnosis of SARS-CoV-2 by  FDA under an Emergency Use Authorization (EUA). This EUA will remain  in effect (meaning this test can be used) for the duration of the  Covid-19 declaration under Section 564(b)(1) of the Act, 21  U.S.C. section 360bbb-3(b)(1), unless the authorization is  terminated or revoked. Performed at [facility], [place] [address], [place], [place] [address]          Radiology Studies: CT ABDOMEN PELVIS W CONTRAST  Result Date: 12/09/2019 CLINICAL DATA:  Abdominal pain. EXAM: CT ABDOMEN AND PELVIS WITH CONTRAST TECHNIQUE: Multidetector CT imaging of the abdomen and pelvis was performed using the standard protocol following bolus administration of intravenous contrast. CONTRAST:  175mL OMNIPAQUE IOHEXOL 300 MG/ML  SOLN COMPARISON:  June 10, 2016 FINDINGS: Lower chest: No acute abnormality. Hepatobiliary: No focal liver abnormality is seen. No gallstones, gallbladder wall thickening, or biliary dilatation. Pancreas: Unremarkable. No pancreatic ductal dilatation or surrounding inflammatory changes. Spleen: Normal in size without focal abnormality. Adrenals/Urinary Tract: Adrenal glands are unremarkable. Kidneys are normal, without renal calculi, focal lesion, or hydronephrosis. Bladder is unremarkable. Stomach/Bowel: Stomach is within normal limits. Appendix appears normal. No evidence of bowel wall thickening, distention, or inflammatory changes.  Vascular/Lymphatic: No significant vascular findings are present. No enlarged abdominal or pelvic lymph nodes. Reproductive: The prostate gland is moderately enlarged. Other: No abdominal wall hernia or abnormality. No abdominopelvic ascites. Musculoskeletal: Moderate severity degenerative changes are seen at the levels of L4-L5 and L5-S1. IMPRESSION: 1. No CT evidence of acute or active process within the abdomen or pelvis. 2. Moderate severity degenerative changes at the levels of L4-L5 and L5-S1. 3. Enlarged prostate gland. Electronically Signed   By: Virgina Norfolk M.D.   On: 12/09/2019 20:04   US Abdomen Limited RUQ (LIVER/GB)  Result Date: 12/09/2019 CLINICAL DATA:  Hyperbilirubinemia EXAM: ULTRASOUND ABDOMEN LIMITED RIGHT UPPER QUADRANT COMPARISON:  CT 12/09/2019 FINDINGS: Gallbladder: No gallstones or wall thickening visualized. No sonographic Murphy sign noted by sonographer. Common bile duct: Diameter: Normal caliber, 4 mm Liver: No focal lesion identified.  Within normal limits in parenchymal echogenicity. Portal vein is patent on color Doppler imaging with normal direction of blood flow towards the liver. Other: None. IMPRESSION: Normal right upper quadrant ultrasound. Electronically Signed   By: Rolm Baptise M.D.   On: 12/09/2019 20:27        Scheduled Meds: . montelukast  10 mg Oral QHS   Continuous Infusions: . sodium chloride 150 mL/hr at 12/11/19 1150     LOS: 1 day     Georgette Shell, MD  12/11/2019, 1:05 PM

## 2019-12-12 LAB — COMPREHENSIVE METABOLIC PANEL
ALT: 73 U/L — ABNORMAL HIGH (ref 0–44)
AST: 62 U/L — ABNORMAL HIGH (ref 15–41)
Albumin: 2.5 g/dL — ABNORMAL LOW (ref 3.5–5.0)
Alkaline Phosphatase: 212 U/L — ABNORMAL HIGH (ref 38–126)
Anion gap: 6 (ref 5–15)
BUN: 9 mg/dL (ref 8–23)
CO2: 21 mmol/L — ABNORMAL LOW (ref 22–32)
Calcium: 8.2 mg/dL — ABNORMAL LOW (ref 8.9–10.3)
Chloride: 106 mmol/L (ref 98–111)
Creatinine, Ser: 0.64 mg/dL (ref 0.61–1.24)
GFR, Estimated: >60 mL/min (ref >60)
Glucose, Bld: 96 mg/dL (ref 70–99)
Potassium: 3.8 mmol/L (ref 3.5–5.1)
Sodium: 133 mmol/L — ABNORMAL LOW (ref 135–145)
Total Bilirubin: 13.8 mg/dL — ABNORMAL HIGH (ref 0.3–1.2)
Total Protein: 5.7 g/dL — ABNORMAL LOW (ref 6.5–8.1)

## 2019-12-12 LAB — COMPLEMENT, TOTAL: Compl, Total (CH50): 56 U/mL (ref >41)

## 2019-12-12 LAB — CBC
HCT: 36 % — ABNORMAL LOW (ref 39.0–52.0)
Hemoglobin: 12.4 g/dL — ABNORMAL LOW (ref 13.0–17.0)
MCH: 29.8 pg (ref 26.0–34.0)
MCHC: 34.4 g/dL (ref 30.0–36.0)
MCV: 86.5 fL (ref 80.0–100.0)
Platelets: 260 10*3/uL (ref 150–400)
RBC: 4.16 MIL/uL — ABNORMAL LOW (ref 4.22–5.81)
RDW: 14.6 % (ref 11.5–15.5)
WBC: 7.9 10*3/uL (ref 4.0–10.5)
nRBC: 0 % (ref 0.0–0.2)

## 2019-12-12 NOTE — Progress Notes (Addendum)
PROGRESS NOTE    Ronnie [name]  IFO:[id number] DOB: 01-Jun-1955 DOA: 12/09/2019 PCP: Venia Carbon, MD   Brief Narrative:64 y.o. male with no significant past medical history was referred to the ER after patient had abnormal labs done as outpatient.  Patient states about 5 days ago patient started having intermittent swelling of his lips tongue throat ears and itching of the trunk and extremities off and on.  Patient had a similar symptom about 6 months ago which resolved without any intervention.  But at this time it is more protracted.  Given the symptoms patient had gone to the urgent care about 3 days ago and at that time patient was given antihistamine and Pepcid.  After taking the medication patient symptoms resolved.  Patient states that during that visit patient had blood drawn which showed abnormality and patient's primary care physician referred patient to the ER.  Patient denies drinking alcohol and using Tylenol or any recent outside travel.  ED Course: On exam in the ER patient appears jaundiced.  Labs are significant for total bilirubin of 18.6 alkaline phosphatase 230 lipase 28 AST 49 ALT 81 sodium 133 WBC 11.8.  CT abdomen pelvis and sonogram were unremarkable.  Acute hepatitis panel has been drawn and patient admitted for further observation.    Assessment & Plan:   Principal Problem:   Jaundice Active Problems:   BPH with obstruction/lower urinary tract symptoms   Elevated PSA   #1  Elevated LFTs/jaundice-unclear cause. Fortunately LFTs improving. T bilirubin down to 13.8 from 15.6. CT abdomen no CT evidence of active process within the abdomen or pelvis, moderate severity degenerative changes L4-L5 and S1.  Enlarged prostate gland.  ultrasound normal right upper quadrant ultrasound Appreciate GI input await further recommendation question if he needs biopsy of liver  #2 hypokalemia resolved  #3 enlarged prostate denies any symptoms  Estimated body mass index  is 25.52 kg/m as calculated from the following:   Height as of this encounter: 5\' 11"  (1.803 m).   Weight as of this encounter: 83 kg.  DVT prophylaxis: Lovenox  code Status: Full code Family Communication: None at bedside  disposition Plan:  Status is: Observation  Dispo: The patient is from: Home              Anticipated d/c is to: Home              Anticipated d/c date is: 2 days              Patient currently is not medically stable to d/c.  Consultants:   GI  Procedures: None Antimicrobials: None  Subjective: Sitting by the bedside ordering breakfast.  No family at bedside.  No further bowel movement since yesterday.  objective: Vitals:   12/11/19 0629 12/11/19 1344 12/11/19 2101 12/12/19 0533  BP: 104/70 111/65 123/74 113/67  Pulse: 90 92 92 84  Resp: 18 17 18 17   Temp: 99.6 F (37.6 C) 98.7 F (37.1 C) 99.5 F (37.5 C) 99.3 F (37.4 C)  TempSrc: Oral Oral Oral Oral  SpO2: 95% 98% 95% 95%  Weight:      Height:        Intake/Output Summary (Last 24 hours) at 12/12/2019 1230 Last data filed at 12/12/2019 0600 Gross per 24 hour  Intake 3069.53 ml  Output 2675 ml  Net 394.53 ml   Filed Weights   12/09/19 2233  Weight: 83 kg    Examination:  General exam: Appears calm and comfortable  Respiratory  system: Clear to auscultation. Respiratory effort normal. Cardiovascular system: S1 & S2 heard, RRR. No JVD, murmurs, rubs, gallops or clicks. No pedal edema. Gastrointestinal system: Abdomen is nondistended, soft and nontender. No organomegaly or masses felt. Normal bowel sounds heard. Central nervous system: Alert and oriented. No focal neurological deficits. Extremities: Symmetric 5 x 5 power. Skin: No rashes, lesions or ulcers Psychiatry: Judgement and insight appear normal. Mood & affect appropriate.     Data Reviewed: I have personally reviewed following labs and imaging studies  CBC: Recent Labs  Lab 12/09/19 1856 12/09/19 1919 12/10/19 0434  12/12/19 0525  WBC 11.8*  --  8.9 7.9  NEUTROABS 8.8*  --  6.4  --   HGB 14.5 12.9* 13.0 12.4*  HCT 43.5 38.0* 37.5* 36.0*  MCV 87.9  --  86.6 86.5  PLT 317  --  273 034   Basic Metabolic Panel: Recent Labs  Lab 12/09/19 1856 12/09/19 1919 12/10/19 0434 12/11/19 0421 12/12/19 0525  NA 133* 138 134* 134* 133*  K 3.5 2.8* 3.9 4.1 3.8  CL 95* 103 98 103 106  CO2 24  --  26 23 21*  GLUCOSE 100* 82 92 107* 96  BUN 13 11 15 11 9   CREATININE 0.70 0.70 0.76 0.68 0.64  CALCIUM 9.2  --  8.4* 8.2* 8.2*   GFR: Estimated Creatinine Clearance: 100.7 mL/min (by C-G formula based on SCr of 0.64 mg/dL). Liver Function Tests: Recent Labs  Lab 12/09/19 1856 12/10/19 0434 12/11/19 0421 12/12/19 0525  AST 49* 49* 58* 62*  ALT 81* 64* 73* 73*  ALKPHOS 230* 204* 212* 212*  BILITOT 18.6* 15.6* 15.6* 13.8*  PROT 7.3 5.9* 6.1* 5.7*  ALBUMIN 3.2* 2.7* 2.6* 2.5*   Recent Labs  Lab 12/09/19 1856  LIPASE 28   No results for input(s): AMMONIA in the last 168 hours. Coagulation Profile: Recent Labs  Lab 12/09/19 1856 12/11/19 0421  INR 1.3* 1.2   Cardiac Enzymes: No results for input(s): CKTOTAL, CKMB, CKMBINDEX, TROPONINI in the last 168 hours. BNP (last 3 results) No results for input(s): PROBNP in the last 8760 hours. HbA1C: No results for input(s): HGBA1C in the last 72 hours. CBG: No results for input(s): GLUCAP in the last 168 hours. Lipid Profile: No results for input(s): CHOL, HDL, LDLCALC, TRIG, CHOLHDL, LDLDIRECT in the last 72 hours. Thyroid Function Tests: Recent Labs    12/10/19 0533  TSH 1.268   Anemia Panel: No results for input(s): VITAMINB12, FOLATE, FERRITIN, TIBC, IRON, RETICCTPCT in the last 72 hours. Sepsis Labs: Recent Labs  Lab 12/09/19 1856  LATICACIDVEN 1.2    Recent Results (from the past 240 hour(s))  Respiratory Panel by RT PCR (Flu A&B, Covid) - Nasopharyngeal Swab     Status: None   Collection Time: 12/09/19  7:55 PM   Specimen:  Nasopharyngeal Swab  Result Value Ref Range Status   SARS Coronavirus 2 by RT PCR NEGATIVE NEGATIVE Final    Comment: (NOTE) SARS-CoV-2 target nucleic acids are NOT DETECTED.  The SARS-CoV-2 RNA is generally detectable in upper respiratoy specimens during the acute phase of infection. The lowest concentration of SARS-CoV-2 viral copies this assay can detect is 131 copies/mL. A negative result does not preclude SARS-Cov-2 infection and should not be used as the sole basis for treatment or other patient management decisions. A negative result may occur with  improper specimen collection/handling, submission of specimen other than nasopharyngeal swab, presence of viral mutation(s) within the areas targeted by this assay,  and inadequate number of viral copies (<131 copies/mL). A negative result must be combined with clinical observations, patient history, and epidemiological information. The expected result is Negative.  Fact Sheet for Patients:  PinkCheek.be  Fact Sheet for Healthcare Providers:  GravelBags.it  This test is no t yet approved or cleared by the [place] FDA and  has been authorized for detection and/or diagnosis of SARS-CoV-2 by FDA under an Emergency Use Authorization (EUA). This EUA will remain  in effect (meaning this test can be used) for the duration of the COVID-19 declaration under Section 564(b)(1) of the Act, 21 U.S.C. section 360bbb-3(b)(1), unless the authorization is terminated or revoked sooner.     Influenza A by PCR NEGATIVE NEGATIVE Final   Influenza B by PCR NEGATIVE NEGATIVE Final    Comment: (NOTE) The Xpert Xpress SARS-CoV-2/FLU/RSV assay is intended as an aid in  the diagnosis of influenza from Nasopharyngeal swab specimens and  should not be used as a sole basis for treatment. Nasal washings and  aspirates are unacceptable for Xpert Xpress SARS-CoV-2/FLU/RSV  testing.  Fact Sheet  for Patients: PinkCheek.be  Fact Sheet for Healthcare Providers: GravelBags.it  This test is not yet approved or cleared by the [place] FDA and  has been authorized for detection and/or diagnosis of SARS-CoV-2 by  FDA under an Emergency Use Authorization (EUA). This EUA will remain  in effect (meaning this test can be used) for the duration of the  Covid-19 declaration under Section 564(b)(1) of the Act, 21  U.S.C. section 360bbb-3(b)(1), unless the authorization is  terminated or revoked. Performed at [facility], [place] [address], [place], [place] [address]          Radiology Studies: No results found.      Scheduled Meds: . montelukast  10 mg Oral QHS   Continuous Infusions: . sodium chloride 75 mL/hr at 12/12/19 0558     LOS: 2 days     Georgette Shell, MD  12/12/2019, 12:30 PM

## 2019-12-13 DIAGNOSIS — R17 Unspecified jaundice: Secondary | ICD-10-CM | POA: Diagnosis not present

## 2019-12-13 LAB — COMPREHENSIVE METABOLIC PANEL
ALT: 76 U/L — ABNORMAL HIGH (ref 0–44)
AST: 58 U/L — ABNORMAL HIGH (ref 15–41)
Albumin: 2.5 g/dL — ABNORMAL LOW (ref 3.5–5.0)
Alkaline Phosphatase: 227 U/L — ABNORMAL HIGH (ref 38–126)
Anion gap: 9 (ref 5–15)
BUN: 10 mg/dL (ref 8–23)
CO2: 23 mmol/L (ref 22–32)
Calcium: 8.4 mg/dL — ABNORMAL LOW (ref 8.9–10.3)
Chloride: 102 mmol/L (ref 98–111)
Creatinine, Ser: 0.7 mg/dL (ref 0.61–1.24)
GFR, Estimated: >60 mL/min (ref >60)
Glucose, Bld: 96 mg/dL (ref 70–99)
Potassium: 3.8 mmol/L (ref 3.5–5.1)
Sodium: 134 mmol/L — ABNORMAL LOW (ref 135–145)
Total Bilirubin: 11.7 mg/dL — ABNORMAL HIGH (ref 0.3–1.2)
Total Protein: 5.7 g/dL — ABNORMAL LOW (ref 6.5–8.1)

## 2019-12-13 NOTE — Progress Notes (Signed)
PROGRESS NOTE    Ronnie [name]  DPO:[id number] DOB: 06-17-1955 DOA: 12/09/2019 PCP: Venia Carbon, MD   Brief Narrative:64 y.o. male with no significant past medical history was referred to the ER after patient had abnormal labs done as outpatient.  Patient states about 5 days ago patient started having intermittent swelling of his lips tongue throat ears and itching of the trunk and extremities off and on.  Patient had a similar symptom about 6 months ago which resolved without any intervention.  But at this time it is more protracted.  Given the symptoms patient had gone to the urgent care about 3 days ago and at that time patient was given antihistamine and Pepcid.  After taking the medication patient symptoms resolved.  Patient states that during that visit patient had blood drawn which showed abnormality and patient's primary care physician referred patient to the ER.  Patient denies drinking alcohol and using Tylenol or any recent outside travel.  ED Course: On exam in the ER patient appears jaundiced.  Labs are significant for total bilirubin of 18.6 alkaline phosphatase 230 lipase 28 AST 49 ALT 81 sodium 133 WBC 11.8.  CT abdomen pelvis and sonogram were unremarkable.  Acute hepatitis panel has been drawn and patient admitted for further observation.    Assessment & Plan:   Principal Problem:   Jaundice Active Problems:   BPH with obstruction/lower urinary tract symptoms   Elevated PSA   #1  Elevated LFTs/jaundice-unclear cause. Fortunately LFTs improving. T bilirubin down to 11.7 from  13.8 from 15.6. aLK PHOS 227 AST/ALT 58/76 CT abdomen no CT evidence of active process within the abdomen or pelvis, moderate severity degenerative changes L4-L5 and S1.  Enlarged prostate gland.  ultrasound normal right upper quadrant ultrasound Gi recommending MRI/MRCP  #2 hypokalemia resolved  #3 enlarged prostate denies any symptoms  Estimated body mass index is 25.52 kg/m as  calculated from the following:   Height as of this encounter: 5' 11"  (1.803 m).   Weight as of this encounter: 83 kg.  DVT prophylaxis: Lovenox  code Status: Full code Family Communication: None at bedside  disposition Plan:  Status is: Observation  Dispo: The patient is from: Home              Anticipated d/c is to: Home              Anticipated d/c date is: 2 days              Patient currently is not medically stable to d/c.  Consultants:   GI  Procedures: None Antimicrobials: None  Subjective: Awake and alert sitting up in bed ambulated in hallway yesterday Generally feels well every morning but as the time goes by feels very weak by evening  objective: Vitals:   12/12/19 0533 12/12/19 1336 12/12/19 2206 12/13/19 0626  BP: 113/67 120/73 120/79 117/68  Pulse: 84 91 84 79  Resp: 17 17 18 16   Temp: 99.3 F (37.4 C) 98.4 F (36.9 C) 98.4 F (36.9 C) 98.4 F (36.9 C)  TempSrc: Oral Oral Oral Oral  SpO2: 95% 99% 96% 98%  Weight:      Height:        Intake/Output Summary (Last 24 hours) at 12/13/2019 1318 Last data filed at 12/13/2019 1000 Gross per 24 hour  Intake 240 ml  Output 2475 ml  Net -2235 ml   Filed Weights   12/09/19 2233  Weight: 83 kg    Examination:  General  exam: Appears calm and comfortable  Respiratory system: Clear to auscultation. Respiratory effort normal. Cardiovascular system: S1 & S2 heard, RRR. No JVD, murmurs, rubs, gallops or clicks. No pedal edema. Gastrointestinal system: Abdomen is nondistended, soft and nontender. No organomegaly or masses felt. Normal bowel sounds heard. Central nervous system: Alert and oriented. No focal neurological deficits. Extremities: Symmetric 5 x 5 power. Skin: No rashes, lesions or ulcers Psychiatry: Judgement and insight appear normal. Mood & affect appropriate.     Data Reviewed: I have personally reviewed following labs and imaging studies  CBC: Recent Labs  Lab 12/09/19 1856  12/09/19 1919 12/10/19 0434 12/12/19 0525  WBC 11.8*  --  8.9 7.9  NEUTROABS 8.8*  --  6.4  --   HGB 14.5 12.9* 13.0 12.4*  HCT 43.5 38.0* 37.5* 36.0*  MCV 87.9  --  86.6 86.5  PLT 317  --  273 176   Basic Metabolic Panel: Recent Labs  Lab 12/09/19 1856 12/09/19 1856 12/09/19 1919 12/10/19 0434 12/11/19 0421 12/12/19 0525 12/13/19 0553  NA 133*   < > 138 134* 134* 133* 134*  K 3.5   < > 2.8* 3.9 4.1 3.8 3.8  CL 95*   < > 103 98 103 106 102  CO2 24  --   --  26 23 21* 23  GLUCOSE 100*   < > 82 92 107* 96 96  BUN 13   < > 11 15 11 9 10   CREATININE 0.70   < > 0.70 0.76 0.68 0.64 0.70  CALCIUM 9.2  --   --  8.4* 8.2* 8.2* 8.4*   < > = values in this interval not displayed.   GFR: Estimated Creatinine Clearance: 100.7 mL/min (by C-G formula based on SCr of 0.7 mg/dL). Liver Function Tests: Recent Labs  Lab 12/09/19 1856 12/10/19 0434 12/11/19 0421 12/12/19 0525 12/13/19 0553  AST 49* 49* 58* 62* 58*  ALT 81* 64* 73* 73* 76*  ALKPHOS 230* 204* 212* 212* 227*  BILITOT 18.6* 15.6* 15.6* 13.8* 11.7*  PROT 7.3 5.9* 6.1* 5.7* 5.7*  ALBUMIN 3.2* 2.7* 2.6* 2.5* 2.5*   Recent Labs  Lab 12/09/19 1856  LIPASE 28   No results for input(s): AMMONIA in the last 168 hours. Coagulation Profile: Recent Labs  Lab 12/09/19 1856 12/11/19 0421  INR 1.3* 1.2   Cardiac Enzymes: No results for input(s): CKTOTAL, CKMB, CKMBINDEX, TROPONINI in the last 168 hours. BNP (last 3 results) No results for input(s): PROBNP in the last 8760 hours. HbA1C: No results for input(s): HGBA1C in the last 72 hours. CBG: No results for input(s): GLUCAP in the last 168 hours. Lipid Profile: No results for input(s): CHOL, HDL, LDLCALC, TRIG, CHOLHDL, LDLDIRECT in the last 72 hours. Thyroid Function Tests: No results for input(s): TSH, T4TOTAL, FREET4, T3FREE, THYROIDAB in the last 72 hours. Anemia Panel: No results for input(s): VITAMINB12, FOLATE, FERRITIN, TIBC, IRON, RETICCTPCT in the last  72 hours. Sepsis Labs: Recent Labs  Lab 12/09/19 1856  LATICACIDVEN 1.2    Recent Results (from the past 240 hour(s))  Respiratory Panel by RT PCR (Flu A&B, Covid) - Nasopharyngeal Swab     Status: None   Collection Time: 12/09/19  7:55 PM   Specimen: Nasopharyngeal Swab  Result Value Ref Range Status   SARS Coronavirus 2 by RT PCR NEGATIVE NEGATIVE Final    Comment: (NOTE) SARS-CoV-2 target nucleic acids are NOT DETECTED.  The SARS-CoV-2 RNA is generally detectable in upper respiratoy specimens during the  acute phase of infection. The lowest concentration of SARS-CoV-2 viral copies this assay can detect is 131 copies/mL. A negative result does not preclude SARS-Cov-2 infection and should not be used as the sole basis for treatment or other patient management decisions. A negative result may occur with  improper specimen collection/handling, submission of specimen other than nasopharyngeal swab, presence of viral mutation(s) within the areas targeted by this assay, and inadequate number of viral copies (<131 copies/mL). A negative result must be combined with clinical observations, patient history, and epidemiological information. The expected result is Negative.  Fact Sheet for Patients:  PinkCheek.be  Fact Sheet for Healthcare Providers:  GravelBags.it  This test is no t yet approved or cleared by the [place] FDA and  has been authorized for detection and/or diagnosis of SARS-CoV-2 by FDA under an Emergency Use Authorization (EUA). This EUA will remain  in effect (meaning this test can be used) for the duration of the COVID-19 declaration under Section 564(b)(1) of the Act, 21 U.S.C. section 360bbb-3(b)(1), unless the authorization is terminated or revoked sooner.     Influenza A by PCR NEGATIVE NEGATIVE Final   Influenza B by PCR NEGATIVE NEGATIVE Final    Comment: (NOTE) The Xpert Xpress  SARS-CoV-2/FLU/RSV assay is intended as an aid in  the diagnosis of influenza from Nasopharyngeal swab specimens and  should not be used as a sole basis for treatment. Nasal washings and  aspirates are unacceptable for Xpert Xpress SARS-CoV-2/FLU/RSV  testing.  Fact Sheet for Patients: PinkCheek.be  Fact Sheet for Healthcare Providers: GravelBags.it  This test is not yet approved or cleared by the [place] FDA and  has been authorized for detection and/or diagnosis of SARS-CoV-2 by  FDA under an Emergency Use Authorization (EUA). This EUA will remain  in effect (meaning this test can be used) for the duration of the  Covid-19 declaration under Section 564(b)(1) of the Act, 21  U.S.C. section 360bbb-3(b)(1), unless the authorization is  terminated or revoked. Performed at [facility], [place] [address] [place], [place] [address]          Radiology Studies: No results found.      Scheduled Meds: . montelukast  10 mg Oral QHS   Continuous Infusions:    LOS: 3 days     Georgette Shell, MD  12/13/2019, 1:18 PM

## 2019-12-13 NOTE — Progress Notes (Addendum)
[facility] Progress Note  CC: Jaundice, elevated LFTs  Subjective: He reports feeling better today, energy level improved. He passed a brown BM earlier this am. No N/V. No abdominal pain.  No antibiotics, new medications or supplements within the past 6 months.   Objective:  Vital signs in last 24 hours: Temp:  [98.4 F (36.9 C)] 98.4 F (36.9 C) (10/24 0626) Pulse Rate:  [79-91] 79 (10/24 0626) Resp:  [16-18] 16 (10/24 0626) BP: (117-120)/(68-79) 117/68 (10/24 0626) SpO2:  [96 %-99 %] 98 % (10/24 0626) Last BM Date: 12/12/19   General:   Alert 64 year old male in NAD.  Eyes: Moderate scleral icterus.  Heart: RRR, no murmur.  Pulm: Breath sounds clear throughout.  Abdomen: Soft, nontender. No HSM. No mass. + BS x 4 quads.  Extremities:  Without edema. Neurologic:  Alert and  oriented x4;  grossly normal neurologically. Psych:  Alert and cooperative. Normal mood and affect. Skin: Jaundiced.   Intake/Output from previous day: 10/23 0701 - 10/24 0700 In: 622.9 [P.O.:240; I.V.:382.9] Out: 9794 [Urine:3475] Intake/Output this shift: No intake/output data recorded.  Lab Results: Recent Labs    12/12/19 0525  WBC 7.9  HGB 12.4*  HCT 36.0*  PLT 260   BMET Recent Labs    12/11/19 0421 12/12/19 0525 12/13/19 0553  NA 134* 133* 134*  K 4.1 3.8 3.8  CL 103 106 102  CO2 23 21* 23  GLUCOSE 107* 96 96  BUN _0 CREATININE 0.68 0.64 0.70  CALCIUM 8.2* 8.2* 8.4*   LFT Recent Labs    12/13/19 0553  PROT 5.7*  ALBUMIN 2.5*  AST 58*  ALT 76*  ALKPHOS 227*  BILITOT 11.7*   PT/INR Recent Labs    12/11/19 0421  LABPROT 14.5  INR 1.2   Hepatitis Panel No results for input(s): HEPBSAG, HCVAB, HEPAIGM, HEPBIGM in the last 72 hours.  No results found.  Assessment / Plan:  52. 64 year old male with elevated T. Bili and LFTs with jaundice. Direct bili >indirect. Hep C antibody +. Hep C RNA PCR undetectable. CMV IgM +.  Hep B surface ag  negative. HIV negative. IgG 798.   ANA, SMA, AMA were normal. GGT 168. Albumin 2.5. Ceruloplasmin 32.2. T. Bili 13.8 -> 11.7. LFs stable with AST 62 ->58. ALT 73 ->76. Alk phos 212 -> 227. No coagulopathy. No abdominal pain. CTAP without evidence of hepatoma or biliary obstruction.  His CMV IgM level indicates acute CMV infection but I suspect there is a concomitant cause for his jaundice and elevated LFTs.   Possible liver biopsy early this week, await further recommendations from Dr. Loletha Carrow [facility]  Diet as tolerated Repeat hepatic panel in am  2. Atypical angioedema syndrome of unclear etiology. C3, C4 and C 1 esterase inhibitor levels pending  3. History of colon polyps -Next colonoscopy due 12/2020  Further recommendations per Dr. Loletha Carrow     Principal Problem:   Jaundice Active Problems:   BPH with obstruction/lower urinary tract symptoms   Elevated PSA     LOS: 3 days   [name]  12/13/2019, 8:50 AM  I have discussed the case with the PA, and that is the plan I formulated. I personally interviewed and examined the patient.  Alkaline phosphatase remains elevated similar range, but bilirubin is slowly improving.  He feels generally better and more energetic.  Tolerating diet without difficulty throughout this clinical course.  Given the improving bilirubin,  I would hold off on liver biopsy at this point.  MRI abdomen/MRCP to rule out Cassia prior to discharge.  GI consult team to see tomorrow.    [name] Office: [phone]

## 2019-12-14 ENCOUNTER — Inpatient Hospital Stay (HOSPITAL_COMMUNITY): Payer: PRIVATE HEALTH INSURANCE

## 2019-12-14 ENCOUNTER — Ambulatory Visit (HOSPITAL_COMMUNITY)
Admit: 2019-12-14 | Discharge: 2019-12-14 | Disposition: A | Discharge status: Home / Self Care | Payer: PRIVATE HEALTH INSURANCE | Attending: Internal Medicine | Admitting: Internal Medicine

## 2019-12-14 DIAGNOSIS — R7989 Other specified abnormal findings of blood chemistry: Secondary | ICD-10-CM | POA: Diagnosis not present

## 2019-12-14 DIAGNOSIS — R17 Unspecified jaundice: Secondary | ICD-10-CM | POA: Diagnosis not present

## 2019-12-14 LAB — COMPREHENSIVE METABOLIC PANEL
ALT: 74 U/L — ABNORMAL HIGH (ref 0–44)
AST: 52 U/L — ABNORMAL HIGH (ref 15–41)
Albumin: 2.5 g/dL — ABNORMAL LOW (ref 3.5–5.0)
Alkaline Phosphatase: 272 U/L — ABNORMAL HIGH (ref 38–126)
Anion gap: 9 (ref 5–15)
BUN: 13 mg/dL (ref 8–23)
CO2: 24 mmol/L (ref 22–32)
Calcium: 8.6 mg/dL — ABNORMAL LOW (ref 8.9–10.3)
Chloride: 102 mmol/L (ref 98–111)
Creatinine, Ser: 0.77 mg/dL (ref 0.61–1.24)
GFR, Estimated: >60 mL/min (ref >60)
Glucose, Bld: 95 mg/dL (ref 70–99)
Potassium: 3.9 mmol/L (ref 3.5–5.1)
Sodium: 135 mmol/L (ref 135–145)
Total Bilirubin: 9.6 mg/dL — ABNORMAL HIGH (ref 0.3–1.2)
Total Protein: 5.8 g/dL — ABNORMAL LOW (ref 6.5–8.1)

## 2019-12-14 LAB — PROTIME-INR
INR: 1.1 (ref 0.8–1.2)
Prothrombin Time: 13.4 seconds (ref 11.4–15.2)

## 2019-12-14 LAB — BILIRUBIN, DIRECT: Bilirubin, Direct: 5.2 mg/dL — ABNORMAL HIGH (ref 0.0–0.2)

## 2019-12-14 MED ORDER — GADOBUTROL 1 MMOL/ML IV SOLN
8.0000 mL | Freq: Once | INTRAVENOUS | Status: AC | PRN
  Administered 2019-12-14: 8 mL via INTRAVENOUS

## 2019-12-14 NOTE — Progress Notes (Signed)
PROGRESS NOTE    Ronnie [name]  LPF:[id number] DOB: Nov 10, 1955 DOA: 12/09/2019 PCP: Venia Carbon, MD   Brief Narrative:64 y.o. male with no significant past medical history was referred to the ER after patient had abnormal labs done as outpatient.  Patient states about 5 days ago patient started having intermittent swelling of his lips tongue throat ears and itching of the trunk and extremities off and on.  Patient had a similar symptom about 6 months ago which resolved without any intervention.  But at this time it is more protracted.  Given the symptoms patient had gone to the urgent care about 3 days ago and at that time patient was given antihistamine and Pepcid.  After taking the medication patient symptoms resolved.  Patient states that during that visit patient had blood drawn which showed abnormality and patient's primary care physician referred patient to the ER.  Patient denies drinking alcohol and using Tylenol or any recent outside travel.  ED Course: On exam in the ER patient appears jaundiced.  Labs are significant for total bilirubin of 18.6 alkaline phosphatase 230 lipase 28 AST 49 ALT 81 sodium 133 WBC 11.8.  CT abdomen pelvis and sonogram were unremarkable.  Acute hepatitis panel has been drawn and patient admitted for further observation.    Assessment & Plan:   Principal Problem:   Jaundice Active Problems:   BPH with obstruction/lower urinary tract symptoms   Elevated PSA   #1  Elevated LFTs/jaundice-unclear cause. Fortunately LFTs improving. T bilirubin down to 9.6 from 11.7 from  13.8 from 15.6. CMV IgM positive aLK PHOS 272 AST/ALT 52/74 CT abdomen no CT evidence of active process within the abdomen or pelvis, moderate severity degenerative changes L4-L5 and S1.  Enlarged prostate gland.  ultrasound normal right upper quadrant ultrasound Gi recommending MRI/MRCP pending for today  #2 hypokalemia resolved  #3 enlarged prostate denies any  symptoms  Estimated body mass index is 25.52 kg/m as calculated from the following:   Height as of this encounter: 5' 11"  (1.803 m).   Weight as of this encounter: 83 kg.  DVT prophylaxis: Lovenox  code Status: Full code Family Communication: None at bedside  disposition Plan:  Status is: Observation  Dispo: The patient is from: Home              Anticipated d/c is to: Home              Anticipated d/c date is: 2 days              Patient currently is not medically stable to d/c.  Consultants:   GI  Procedures: None Antimicrobials: None  Subjective: Wife by the bedside patient sitting up anxious to know what is going on.  Feels okay no nausea vomiting abdominal pain.  Urine clearing up. objective: Vitals:   12/13/19 0626 12/13/19 1332 12/13/19 2156 12/14/19 0650  BP: 117/68 114/79 111/64 107/73  Pulse: 79 81 81 78  Resp: 16 18 18 18   Temp: 98.4 F (36.9 C) 98.7 F (37.1 C) 99.2 F (37.3 C) 98.4 F (36.9 C)  TempSrc: Oral Oral Oral Oral  SpO2: 98% 98% 97% 98%  Weight:      Height:        Intake/Output Summary (Last 24 hours) at 12/14/2019 1149 Last data filed at 12/14/2019 1000 Gross per 24 hour  Intake 1080 ml  Output 1300 ml  Net -220 ml   Filed Weights   12/09/19 2233  Weight: 83 kg  Examination:  General exam: Appears calm and comfortable  Respiratory system: Clear to auscultation. Respiratory effort normal. Cardiovascular system: S1 & S2 heard, RRR. No JVD, murmurs, rubs, gallops or clicks. No pedal edema. Gastrointestinal system: Abdomen is nondistended, soft and nontender. No organomegaly or masses felt. Normal bowel sounds heard. Central nervous system: Alert and oriented. No focal neurological deficits. Extremities: Symmetric 5 x 5 power. Skin: No rashes, lesions or ulcers Psychiatry: Judgement and insight appear normal. Mood & affect appropriate.     Data Reviewed: I have personally reviewed following labs and imaging  studies  CBC: Recent Labs  Lab 12/09/19 1856 12/09/19 1919 12/10/19 0434 12/12/19 0525  WBC 11.8*  --  8.9 7.9  NEUTROABS 8.8*  --  6.4  --   HGB 14.5 12.9* 13.0 12.4*  HCT 43.5 38.0* 37.5* 36.0*  MCV 87.9  --  86.6 86.5  PLT 317  --  273 242   Basic Metabolic Panel: Recent Labs  Lab 12/10/19 0434 12/11/19 0421 12/12/19 0525 12/13/19 0553 12/14/19 0509  NA 134* 134* 133* 134* 135  K 3.9 4.1 3.8 3.8 3.9  CL 98 103 106 102 102  CO2 26 23 21* 23 24  GLUCOSE 92 107* 96 96 95  BUN 15 11 9 10 13   CREATININE 0.76 0.68 0.64 0.70 0.77  CALCIUM 8.4* 8.2* 8.2* 8.4* 8.6*   GFR: Estimated Creatinine Clearance: 100.7 mL/min (by C-G formula based on SCr of 0.77 mg/dL). Liver Function Tests: Recent Labs  Lab 12/10/19 0434 12/11/19 0421 12/12/19 0525 12/13/19 0553 12/14/19 0509  AST 49* 58* 62* 58* 52*  ALT 64* 73* 73* 76* 74*  ALKPHOS 204* 212* 212* 227* 272*  BILITOT 15.6* 15.6* 13.8* 11.7* 9.6*  PROT 5.9* 6.1* 5.7* 5.7* 5.8*  ALBUMIN 2.7* 2.6* 2.5* 2.5* 2.5*   Recent Labs  Lab 12/09/19 1856  LIPASE 28   No results for input(s): AMMONIA in the last 168 hours. Coagulation Profile: Recent Labs  Lab 12/09/19 1856 12/11/19 0421 12/14/19 1026  INR 1.3* 1.2 1.1   Cardiac Enzymes: No results for input(s): CKTOTAL, CKMB, CKMBINDEX, TROPONINI in the last 168 hours. BNP (last 3 results) No results for input(s): PROBNP in the last 8760 hours. HbA1C: No results for input(s): HGBA1C in the last 72 hours. CBG: No results for input(s): GLUCAP in the last 168 hours. Lipid Profile: No results for input(s): CHOL, HDL, LDLCALC, TRIG, CHOLHDL, LDLDIRECT in the last 72 hours. Thyroid Function Tests: No results for input(s): TSH, T4TOTAL, FREET4, T3FREE, THYROIDAB in the last 72 hours. Anemia Panel: No results for input(s): VITAMINB12, FOLATE, FERRITIN, TIBC, IRON, RETICCTPCT in the last 72 hours. Sepsis Labs: Recent Labs  Lab 12/09/19 1856  LATICACIDVEN 1.2    Recent  Results (from the past 240 hour(s))  Respiratory Panel by RT PCR (Flu A&B, Covid) - Nasopharyngeal Swab     Status: None   Collection Time: 12/09/19  7:55 PM   Specimen: Nasopharyngeal Swab  Result Value Ref Range Status   SARS Coronavirus 2 by RT PCR NEGATIVE NEGATIVE Final    Comment: (NOTE) SARS-CoV-2 target nucleic acids are NOT DETECTED.  The SARS-CoV-2 RNA is generally detectable in upper respiratoy specimens during the acute phase of infection. The lowest concentration of SARS-CoV-2 viral copies this assay can detect is 131 copies/mL. A negative result does not preclude SARS-Cov-2 infection and should not be used as the sole basis for treatment or other patient management decisions. A negative result may occur with  improper specimen  collection/handling, submission of specimen other than nasopharyngeal swab, presence of viral mutation(s) within the areas targeted by this assay, and inadequate number of viral copies (<131 copies/mL). A negative result must be combined with clinical observations, patient history, and epidemiological information. The expected result is Negative.  Fact Sheet for Patients:  PinkCheek.be  Fact Sheet for Healthcare Providers:  GravelBags.it  This test is no t yet approved or cleared by the [place] FDA and  has been authorized for detection and/or diagnosis of SARS-CoV-2 by FDA under an Emergency Use Authorization (EUA). This EUA will remain  in effect (meaning this test can be used) for the duration of the COVID-19 declaration under Section 564(b)(1) of the Act, 21 U.S.C. section 360bbb-3(b)(1), unless the authorization is terminated or revoked sooner.     Influenza A by PCR NEGATIVE NEGATIVE Final   Influenza B by PCR NEGATIVE NEGATIVE Final    Comment: (NOTE) The Xpert Xpress SARS-CoV-2/FLU/RSV assay is intended as an aid in  the diagnosis of influenza from Nasopharyngeal swab  specimens and  should not be used as a sole basis for treatment. Nasal washings and  aspirates are unacceptable for Xpert Xpress SARS-CoV-2/FLU/RSV  testing.  Fact Sheet for Patients: PinkCheek.be  Fact Sheet for Healthcare Providers: GravelBags.it  This test is not yet approved or cleared by the [place] FDA and  has been authorized for detection and/or diagnosis of SARS-CoV-2 by  FDA under an Emergency Use Authorization (EUA). This EUA will remain  in effect (meaning this test can be used) for the duration of the  Covid-19 declaration under Section 564(b)(1) of the Act, 21  U.S.C. section 360bbb-3(b)(1), unless the authorization is  terminated or revoked. Performed at [facility], [place] [address], [place],  [address]          Radiology Studies: No results found.      Scheduled Meds:  montelukast  10 mg Oral QHS   Continuous Infusions:    LOS: 4 days     Georgette Shell, MD  12/14/2019, 11:49 AM

## 2019-12-14 NOTE — Progress Notes (Signed)
Patient ID: Ronnie [name], male   DOB: 1956/02/10, 64 y.o.   MRN: [id number]    Progress Note   Subjective   Day # 4  CC; Jaundice Today - T bili 9.6/ alk phos 272/AST 52/ ALT 74  CMV IGM + No other available new labs  MRI/ MRCP - pending today   PT feels ok - no abd pain, no  Nausea, no fever , still some fatigue    Objective   Vital signs in last 24 hours: Temp:  [98.4 F (36.9 C)-99.2 F (37.3 C)] 98.4 F (36.9 C) (10/25 0650) Pulse Rate:  [78-81] 78 (10/25 0650) Resp:  [18] 18 (10/25 0650) BP: (107-114)/(64-79) 107/73 (10/25 0650) SpO2:  [97 %-98 %] 98 % (10/25 0650) Last BM Date: 12/12/19 General:    Jaundiced WM  in NAD Heart:  Regular rate and rhythm; no murmurs Lungs: Respirations even and unlabored, lungs CTA bilaterally Abdomen:  Soft, nontender and nondistended. Normal bowel sounds. Extremities:  Without edema. Neurologic:  Alert and oriented,  grossly normal neurologically. Psych:  Cooperative. Normal mood and affect.  Intake/Output from previous day: 10/24 0701 - 10/25 0700 In: 960 [P.O.:960] Out: 1150 [Urine:1150] Intake/Output this shift: No intake/output data recorded.  Lab Results: Recent Labs    12/12/19 0525  WBC 7.9  HGB 12.4*  HCT 36.0*  PLT 260   BMET Recent Labs    12/12/19 0525 12/13/19 0553 12/14/19 0509  NA 133* 134* 135  K 3.8 3.8 3.9  CL 106 102 102  CO2 21* 23 24  GLUCOSE 96 96 95  BUN _0 CREATININE 0.64 0.70 0.77  CALCIUM 8.2* 8.4* 8.6*   LFT Recent Labs    12/14/19 0509  PROT 5.8*  ALBUMIN 2.5*  AST 52*  ALT 74*  ALKPHOS 272*  BILITOT 9.6*   PT/INR No results for input(s): LABPROT, INR in the last 72 hours.  Studies/Results: No results found.     Assessment / Plan:     #1 64 yo WM with onset jaundice over the past 10 days, associated fatigue, this episode stated about 2 days after a recurring symptom of random swelling of the lip, throat and localized swelling of the feet with firm nodules  palpable - consistent  with an angioedema picture of unclear etiology.  Workup thus far revealing only for + CMV IGM  Parameters stable, and Bili gradually trending down MRI/MRCP to be done today - machine currently down  Await MRI  C1 esterase , and compliment levels negative- pt will need further outpt work up of angioedema , allergy testing    Principal Problem:   Jaundice Active Problems:   BPH with obstruction/lower urinary tract symptoms   Elevated PSA     LOS: 4 days   Lasasha Brophy EsterwoodPA-C  12/14/2019, 8:37 AM

## 2019-12-15 ENCOUNTER — Telehealth: Payer: Self-pay

## 2019-12-15 ENCOUNTER — Telehealth: Payer: Self-pay | Admitting: *Deleted

## 2019-12-15 ENCOUNTER — Ambulatory Visit (HOSPITAL_COMMUNITY): Payer: PRIVATE HEALTH INSURANCE

## 2019-12-15 LAB — HEPATIC FUNCTION PANEL
ALT: 76 U/L — ABNORMAL HIGH (ref 0–44)
AST: 62 U/L — ABNORMAL HIGH (ref 15–41)
Albumin: 2.6 g/dL — ABNORMAL LOW (ref 3.5–5.0)
Alkaline Phosphatase: 306 U/L — ABNORMAL HIGH (ref 38–126)
Bilirubin, Direct: 4.6 mg/dL — ABNORMAL HIGH (ref 0.0–0.2)
Indirect Bilirubin: 3.4 mg/dL — ABNORMAL HIGH (ref 0.3–0.9)
Total Bilirubin: 8 mg/dL — ABNORMAL HIGH (ref 0.3–1.2)
Total Protein: 6 g/dL — ABNORMAL LOW (ref 6.5–8.1)

## 2019-12-15 NOTE — Telephone Encounter (Signed)
Beth nurse with [facility] left a voicemail stating that patient is scheduled for a hospital follow-up with Dr. Silvio Pate Monday. Beth stated that they advised the patient that he needed to come to their office Monday for some lab work. Beth stated that patient refused to come to their office and stated that he will get the lab work done when he sees Dr. Silvio Pate. Beth wants to know if it is okay for patient to have the lab work done at [facility] Monday?  Beth stated that he needs hepatic test done. Beth stated that she will be glad to put the order in under the GI doctor if Dr. Silvio Pate wants her to so that the results will come back to them. Beth requested a call back.

## 2019-12-15 NOTE — Discharge Summary (Addendum)
Physician Discharge Summary  Ronnie [name] ALP:[id number] DOB: 1955-11-14 DOA: 12/09/2019  PCP: Venia Carbon, MD  Admit date: 12/09/2019 Discharge date: 12/15/2019  Admitted From: Home Disposition: Home Recommendations for Outpatient Follow-up:  1. Follow up with PCP in 1-2 weeks 2. Please obtain CMP/CBC in one week 3. Please follow up with Dr. Hilarie Fredrickson  Home Health: None Equipment/Devices: None  Discharge Condition stable CODE STATUS full code  diet recommendation: Cardiac diet Brief/Interim Summary:64 y.o.malewithno significant past medical history was referred to the ER after patient had abnormal labs done as outpatient. Patient states about 5 days ago patient started having intermittent swelling of his lips tongue throat ears and itching of the trunk and extremities off and on. Patient had a similar symptom about 6 months ago which resolved without any intervention. But at this time it is more protracted. Given the symptoms patient had gone to the urgent care about 3 days ago and at that time patient was given antihistamine and Pepcid. After taking the medication patient symptoms resolved. Patient states that during that visit patient had blood drawn which showed abnormality and patient's primary care physician referred patient to the ER. Patient denies drinking alcohol and using Tylenol or any recent outside travel.  ED Course:On exam in the ER patient appears jaundiced. Labs are significant for total bilirubin of 18.6 alkaline phosphatase 230 lipase 28 AST 49 ALT 81 sodium 133 WBC 11.8. CT abdomen pelvis and sonogram were unremarkable.    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  His CMV IgM came back positive viral load pending at the time of discharge.  On admission his bilirubin  was 15.6 came down to 8 on the day of discharge.  However his alkaline phosphatase remained elevated between 250-300. He was followed by GI during this hospital stay. CT of the abdomen no evidence of active process within the abdomen or pelvis, L4-L5 and S1 DJD and large prostate.  Normal right upper quadrant ultrasound.  MRI MRCP-12/14/2019 mild splenomegaly, no gallstones or common bile duct stones small hepatic and renal cyst. He will follow-up with his PCP and get CMP done next week.  He will also follow-up with GI Dr. MEDOFF  Estimated body mass index is 25.52 kg/m as calculated from the following:   Height as of this encounter: 5\' 11"  (1.803 m).   Weight as of this encounter: 83 kg.  Discharge Instructions  Discharge Instructions    Ambulatory referral to Gastroenterology   Complete by: As directed    Patient with elevated T bilirubin and alkaline phosphatase of unclear etiology CMV IgM was positive viral load is pending   Diet - low sodium heart healthy   Complete by: As directed    Increase activity slowly   Complete by: As directed      Allergies as of 12/15/2019   No Known Allergies     Medication List    You have not been prescribed any medications.     Follow-up Information    Venia Carbon, MD Follow up.   Specialties: Internal Medicine, Pediatrics Contact information: [place] [address] [phone]        Jerene Bears, MD Follow up.   Specialty: Gastroenterology Contact information: [address] [place] [address] [phone]  No Known Allergies  Consultations: GI  Procedures/Studies: CT ABDOMEN PELVIS W CONTRAST  Result Date: 12/09/2019 CLINICAL DATA:  Abdominal pain. EXAM: CT ABDOMEN AND PELVIS WITH CONTRAST TECHNIQUE: Multidetector CT imaging of the abdomen and pelvis was performed using the standard protocol following bolus administration of intravenous contrast. CONTRAST:  147mL  OMNIPAQUE IOHEXOL 300 MG/ML  SOLN COMPARISON:  June 10, 2016 FINDINGS: Lower chest: No acute abnormality. Hepatobiliary: No focal liver abnormality is seen. No gallstones, gallbladder wall thickening, or biliary dilatation. Pancreas: Unremarkable. No pancreatic ductal dilatation or surrounding inflammatory changes. Spleen: Normal in size without focal abnormality. Adrenals/Urinary Tract: Adrenal glands are unremarkable. Kidneys are normal, without renal calculi, focal lesion, or hydronephrosis. Bladder is unremarkable. Stomach/Bowel: Stomach is within normal limits. Appendix appears normal. No evidence of bowel wall thickening, distention, or inflammatory changes. Vascular/Lymphatic: No significant vascular findings are present. No enlarged abdominal or pelvic lymph nodes. Reproductive: The prostate gland is moderately enlarged. Other: No abdominal wall hernia or abnormality. No abdominopelvic ascites. Musculoskeletal: Moderate severity degenerative changes are seen at the levels of L4-L5 and L5-S1. IMPRESSION: 1. No CT evidence of acute or active process within the abdomen or pelvis. 2. Moderate severity degenerative changes at the levels of L4-L5 and L5-S1. 3. Enlarged prostate gland. Electronically Signed   By: Virgina Norfolk M.D.   On: 12/09/2019 20:04   MR ABDOMEN MRCP W WO CONTAST  Result Date: 12/14/2019 CLINICAL DATA:  Abdominal pain. Possible biliary obstruction. Hyperbilirubinemia. EXAM: MRI ABDOMEN WITHOUT AND WITH CONTRAST (INCLUDING MRCP) TECHNIQUE: Multiplanar multisequence MR imaging of the abdomen was performed both before and after the administration of intravenous contrast. Heavily T2-weighted images of the biliary and pancreatic ducts were obtained, and three-dimensional MRCP images were rendered by post processing. CONTRAST:  4mL GADAVIST GADOBUTROL 1 MMOL/ML IV SOLN COMPARISON:  CT scan and ultrasound 12/09/2019 FINDINGS: Lower chest: No significant findings in the lower chest. No  pleural effusions or pulmonary lesions. No pericardial effusion. Hepatobiliary: No morphologic findings to suggest cirrhosis. The portal and hepatic veins are patent. Several small scattered nonenhancing hepatic cysts are noted. No worrisome hepatic lesions or intrahepatic biliary dilatation. No definite gallstones or findings for acute cholecystitis. Normal caliber and course of the common bile duct. No common bile duct stones. Pancreas:  No mass, inflammation or ductal dilatation. Spleen: Mild splenomegaly. Spleen measures 14 x 13 x 11 cm. No focal lesions. Adrenals/Urinary Tract: Small renal cysts. No worrisome renal lesions or hydronephrosis. Stomach/Bowel: Visualized portions within the abdomen are unremarkable. Vascular/Lymphatic: No pathologically enlarged lymph nodes identified. No abdominal aortic aneurysm demonstrated. Other:  No ascites or abdominal wall hernia. Musculoskeletal: No significant bony findings. IMPRESSION: 1. Normal pancreaticobiliary tree. No gallstones or common bile duct stones. 2. No morphologic findings to suggest cirrhosis. Mild splenomegaly. 3. Small hepatic and renal cysts. Electronically Signed   By: Marijo Sanes M.D.   On: 12/14/2019 18:38   US Abdomen Limited RUQ (LIVER/GB)  Result Date: 12/09/2019 CLINICAL DATA:  Hyperbilirubinemia EXAM: ULTRASOUND ABDOMEN LIMITED RIGHT UPPER QUADRANT COMPARISON:  CT 12/09/2019 FINDINGS: Gallbladder: No gallstones or wall thickening visualized. No sonographic Murphy sign noted by sonographer. Common bile duct: Diameter: Normal caliber, 4 mm Liver: No focal lesion identified. Within normal limits in parenchymal echogenicity. Portal vein is patent on color Doppler imaging with normal direction of blood flow towards the liver. Other: None. IMPRESSION: Normal right upper quadrant ultrasound. Electronically Signed   By: Rolm Baptise M.D.   On: 12/09/2019 20:27    (Echo, Carotid, EGD,  Colonoscopy, ERCP)    Subjective: Patient resting in  bed awake and alert anxious to go home feels better eating better appetite better had 1 bowel movement yesterday starting to brown up Discharge Exam: Vitals:   12/14/19 2111 12/15/19 0504  BP: 111/65 102/67  Pulse: 82 75  Resp: 17 17  Temp: 98.1 F (36.7 C) 98.1 F (36.7 C)  SpO2: 96% 97%   Vitals:   12/14/19 0650 12/14/19 1330 12/14/19 2111 12/15/19 0504  BP: 107/73 100/64 111/65 102/67  Pulse: 78 84 82 75  Resp: 18 18 17 17   Temp: 98.4 F (36.9 C) 98.3 F (36.8 C) 98.1 F (36.7 C) 98.1 F (36.7 C)  TempSrc: Oral Oral Oral Oral  SpO2: 98% 97% 96% 97%  Weight:      Height:        General: Pt is alert, awake, not in acute distress Cardiovascular: RRR, S1/S2 +, no rubs, no gallops Respiratory: CTA bilaterally, no wheezing, no rhonchi Abdominal: Soft, NT, ND, bowel sounds + Extremities: no edema, no cyanosis    The results of significant diagnostics from this hospitalization (including imaging, microbiology, ancillary and laboratory) are listed below for reference.     Microbiology: Recent Results (from the past 240 hour(s))  Respiratory Panel by RT PCR (Flu A&B, Covid) - Nasopharyngeal Swab     Status: None   Collection Time: 12/09/19  7:55 PM   Specimen: Nasopharyngeal Swab  Result Value Ref Range Status   SARS Coronavirus 2 by RT PCR NEGATIVE NEGATIVE Final    Comment: (NOTE) SARS-CoV-2 target nucleic acids are NOT DETECTED.  The SARS-CoV-2 RNA is generally detectable in upper respiratoy specimens during the acute phase of infection. The lowest concentration of SARS-CoV-2 viral copies this assay can detect is 131 copies/mL. A negative result does not preclude SARS-Cov-2 infection and should not be used as the sole basis for treatment or other patient management decisions. A negative result may occur with  improper specimen collection/handling, submission of specimen other than nasopharyngeal swab, presence of viral mutation(s) within the areas targeted by this  assay, and inadequate number of viral copies (<131 copies/mL). A negative result must be combined with clinical observations, patient history, and epidemiological information. The expected result is Negative.  Fact Sheet for Patients:  PinkCheek.be  Fact Sheet for Healthcare Providers:  GravelBags.it  This test is no t yet approved or cleared by the [place] FDA and  has been authorized for detection and/or diagnosis of SARS-CoV-2 by FDA under an Emergency Use Authorization (EUA). This EUA will remain  in effect (meaning this test can be used) for the duration of the COVID-19 declaration under Section 564(b)(1) of the Act, 21 U.S.C. section 360bbb-3(b)(1), unless the authorization is terminated or revoked sooner.     Influenza A by PCR NEGATIVE NEGATIVE Final   Influenza B by PCR NEGATIVE NEGATIVE Final    Comment: (NOTE) The Xpert Xpress SARS-CoV-2/FLU/RSV assay is intended as an aid in  the diagnosis of influenza from Nasopharyngeal swab specimens and  should not be used as a sole basis for treatment. Nasal washings and  aspirates are unacceptable for Xpert Xpress SARS-CoV-2/FLU/RSV  testing.  Fact Sheet for Patients: PinkCheek.be  Fact Sheet for Healthcare Providers: GravelBags.it  This test is not yet approved or cleared by the [place] FDA and  has been authorized for detection and/or diagnosis of SARS-CoV-2 by  FDA under an Emergency Use Authorization (EUA). This EUA will remain  in effect (meaning this test can  be used) for the duration of the  Covid-19 declaration under Section 564(b)(1) of the Act, 21  U.S.C. section 360bbb-3(b)(1), unless the authorization is  terminated or revoked. Performed at [facility], [place] [address] [place], [place] [address]      Labs: BNP (last 3 results) No results for input(s): BNP in the  last 8760 hours. Basic Metabolic Panel: Recent Labs  Lab 12/10/19 0434 12/11/19 0421 12/12/19 0525 12/13/19 0553 12/14/19 0509  NA 134* 134* 133* 134* 135  K 3.9 4.1 3.8 3.8 3.9  CL 98 103 106 102 102  CO2 26 23 21* 23 24  GLUCOSE 92 107* 96 96 95  BUN 15 11 9 10 13   CREATININE 0.76 0.68 0.64 0.70 0.77  CALCIUM 8.4* 8.2* 8.2* 8.4* 8.6*   Liver Function Tests: Recent Labs  Lab 12/11/19 0421 12/12/19 0525 12/13/19 0553 12/14/19 0509 12/15/19 0438  AST 58* 62* 58* 52* 62*  ALT 73* 73* 76* 74* 76*  ALKPHOS 212* 212* 227* 272* 306*  BILITOT 15.6* 13.8* 11.7* 9.6* 8.0*  PROT 6.1* 5.7* 5.7* 5.8* 6.0*  ALBUMIN 2.6* 2.5* 2.5* 2.5* 2.6*   Recent Labs  Lab 12/09/19 1856  LIPASE 28   No results for input(s): AMMONIA in the last 168 hours. CBC: Recent Labs  Lab 12/09/19 1856 12/09/19 1919 12/10/19 0434 12/12/19 0525  WBC 11.8*  --  8.9 7.9  NEUTROABS 8.8*  --  6.4  --   HGB 14.5 12.9* 13.0 12.4*  HCT 43.5 38.0* 37.5* 36.0*  MCV 87.9  --  86.6 86.5  PLT 317  --  273 260   Cardiac Enzymes: No results for input(s): CKTOTAL, CKMB, CKMBINDEX, TROPONINI in the last 168 hours. BNP: Invalid input(s): POCBNP CBG: No results for input(s): GLUCAP in the last 168 hours. D-Dimer No results for input(s): DDIMER in the last 72 hours. Hgb A1c No results for input(s): HGBA1C in the last 72 hours. Lipid Profile No results for input(s): CHOL, HDL, LDLCALC, TRIG, CHOLHDL, LDLDIRECT in the last 72 hours. Thyroid function studies No results for input(s): TSH, T4TOTAL, T3FREE, THYROIDAB in the last 72 hours.  Invalid input(s): FREET3 Anemia work up No results for input(s): VITAMINB12, FOLATE, FERRITIN, TIBC, IRON, RETICCTPCT in the last 72 hours. Urinalysis    Component Value Date/Time   COLORURINE YELLOW 06/10/2016 2150   APPEARANCEUR HAZY (A) 06/10/2016 2150   LABSPEC 1.019 06/10/2016 2150   PHURINE 7.0 06/10/2016 2150   GLUCOSEU NEGATIVE 06/10/2016 2150   HGBUR SMALL (A)  06/10/2016 2150   BILIRUBINUR NEGATIVE 06/10/2016 2150   BILIRUBINUR negative 05/31/2016 1037   KETONESUR 5 (A) 06/10/2016 2150   PROTEINUR 100 (A) 06/10/2016 2150   UROBILINOGEN 0.2 05/31/2016 1037   UROBILINOGEN 1.0 08/06/2007 1008   NITRITE NEGATIVE 06/10/2016 2150   LEUKOCYTESUR NEGATIVE 06/10/2016 2150   Sepsis Labs Invalid input(s): PROCALCITONIN,  WBC,  LACTICIDVEN Microbiology Recent Results (from the past 240 hour(s))  Respiratory Panel by RT PCR (Flu A&B, Covid) - Nasopharyngeal Swab     Status: None   Collection Time: 12/09/19  7:55 PM   Specimen: Nasopharyngeal Swab  Result Value Ref Range Status   SARS Coronavirus 2 by RT PCR NEGATIVE NEGATIVE Final    Comment: (NOTE) SARS-CoV-2 target nucleic acids are NOT DETECTED.  The SARS-CoV-2 RNA is generally detectable in upper respiratoy specimens during the acute phase of infection. The lowest concentration of SARS-CoV-2 viral copies this assay can detect is 131 copies/mL. A negative result does not  preclude SARS-Cov-2 infection and should not be used as the sole basis for treatment or other patient management decisions. A negative result may occur with  improper specimen collection/handling, submission of specimen other than nasopharyngeal swab, presence of viral mutation(s) within the areas targeted by this assay, and inadequate number of viral copies (<131 copies/mL). A negative result must be combined with clinical observations, patient history, and epidemiological information. The expected result is Negative.  Fact Sheet for Patients:  PinkCheek.be  Fact Sheet for Healthcare Providers:  GravelBags.it  This test is no t yet approved or cleared by the [place] FDA and  has been authorized for detection and/or diagnosis of SARS-CoV-2 by FDA under an Emergency Use Authorization (EUA). This EUA will remain  in effect (meaning this test can be used) for the  duration of the COVID-19 declaration under Section 564(b)(1) of the Act, 21 U.S.C. section 360bbb-3(b)(1), unless the authorization is terminated or revoked sooner.     Influenza A by PCR NEGATIVE NEGATIVE Final   Influenza B by PCR NEGATIVE NEGATIVE Final    Comment: (NOTE) The Xpert Xpress SARS-CoV-2/FLU/RSV assay is intended as an aid in  the diagnosis of influenza from Nasopharyngeal swab specimens and  should not be used as a sole basis for treatment. Nasal washings and  aspirates are unacceptable for Xpert Xpress SARS-CoV-2/FLU/RSV  testing.  Fact Sheet for Patients: PinkCheek.be  Fact Sheet for Healthcare Providers: GravelBags.it  This test is not yet approved or cleared by the [place] FDA and  has been authorized for detection and/or diagnosis of SARS-CoV-2 by  FDA under an Emergency Use Authorization (EUA). This EUA will remain  in effect (meaning this test can be used) for the duration of the  Covid-19 declaration under Section 564(b)(1) of the Act, 21  U.S.C. section 360bbb-3(b)(1), unless the authorization is  terminated or revoked. Performed at [facility], [place] [address], [place], [place] [address]      Time coordinating discharge:  38 minutes  SIGNED:   Georgette Shell, MD  [facility] 12/15/2019, 9:57 AM

## 2019-12-15 NOTE — Telephone Encounter (Signed)
Called to [facility] and left a message for the triage nurse asking if this can be done. Left the number to call me.

## 2019-12-15 NOTE — Progress Notes (Signed)
Patient ID: Ronnie [name], male   DOB: 11/10/1955, 64 y.o.   MRN: [id number]     Progress Note   Subjective   day # 5  CC; acute hepatitis   INR 1.1  Tbili 8.0/direct bili 4.6/alk phos 306/AST 62/ALT 76  CMV PCR quant pending  MRI/MRCP-several small scattered nonenhancing hepatic cysts no worrisome hepatic lesions or intrahepatic biliary dilation no definite gallstones normal caliber of the CBD no CBD stones no findings suggestive of cirrhosis, mild splenomegaly  Patient feels okay, no specific complaints, hoping to go home    Objective   Vital signs in last 24 hours: Temp:  [98.1 F (36.7 C)-98.3 F (36.8 C)] 98.1 F (36.7 C) (10/26 0504) Pulse Rate:  [75-84] 75 (10/26 0504) Resp:  [17-18] 17 (10/26 0504) BP: (100-111)/(64-67) 102/67 (10/26 0504) SpO2:  [96 %-97 %] 97 % (10/26 0504) Last BM Date: 12/14/19 General:    White male in NAD jaundiced Heart:  Regular rate and rhythm; no murmurs Lungs: Respirations even and unlabored, lungs CTA bilaterally Abdomen:  Soft, nontender and nondistended. Normal bowel sounds. Extremities:  Without edema. Neurologic:  Alert and oriented,  grossly normal neurologically. Psych:  Cooperative. Normal mood and affect.  Intake/Output from previous day: 10/25 0701 - 10/26 0700 In: 960 [P.O.:960] Out: 650 [Urine:650] Intake/Output this shift: Total I/O In: -  Out: 900 [Urine:900]  Lab Results: No results for input(s): WBC, HGB, HCT, PLT in the last 72 hours. BMET Recent Labs    12/13/19 0553 12/14/19 0509  NA 134* 135  K 3.8 3.9  CL 102 102  CO2 23 24  GLUCOSE 96 95  BUN 10 13  CREATININE 0.70 0.77  CALCIUM 8.4* 8.6*   LFT Recent Labs    12/15/19 0438  PROT 6.0*  ALBUMIN 2.6*  AST 62*  ALT 76*  ALKPHOS 306*  BILITOT 8.0*  BILIDIR 4.6*  IBILI 3.4*   PT/INR Recent Labs    12/14/19 1026  LABPROT 13.4  INR 1.1    Studies/Results: MR ABDOMEN MRCP W WO CONTAST  Result Date: 12/14/2019 CLINICAL DATA:   Abdominal pain. Possible biliary obstruction. Hyperbilirubinemia. EXAM: MRI ABDOMEN WITHOUT AND WITH CONTRAST (INCLUDING MRCP) TECHNIQUE: Multiplanar multisequence MR imaging of the abdomen was performed both before and after the administration of intravenous contrast. Heavily T2-weighted images of the biliary and pancreatic ducts were obtained, and three-dimensional MRCP images were rendered by post processing. CONTRAST:  25m GADAVIST GADOBUTROL 1 MMOL/ML IV SOLN COMPARISON:  CT scan and ultrasound 12/09/2019 FINDINGS: Lower chest: No significant findings in the lower chest. No pleural effusions or pulmonary lesions. No pericardial effusion. Hepatobiliary: No morphologic findings to suggest cirrhosis. The portal and hepatic veins are patent. Several small scattered nonenhancing hepatic cysts are noted. No worrisome hepatic lesions or intrahepatic biliary dilatation. No definite gallstones or findings for acute cholecystitis. Normal caliber and course of the common bile duct. No common bile duct stones. Pancreas:  No mass, inflammation or ductal dilatation. Spleen: Mild splenomegaly. Spleen measures 14 x 13 x 11 cm. No focal lesions. Adrenals/Urinary Tract: Small renal cysts. No worrisome renal lesions or hydronephrosis. Stomach/Bowel: Visualized portions within the abdomen are unremarkable. Vascular/Lymphatic: No pathologically enlarged lymph nodes identified. No abdominal aortic aneurysm demonstrated. Other:  No ascites or abdominal wall hernia. Musculoskeletal: No significant bony findings. IMPRESSION: 1. Normal pancreaticobiliary tree. No gallstones or common bile duct stones. 2. No morphologic findings to suggest cirrhosis. Mild splenomegaly. 3. Small hepatic and renal cysts. Electronically Signed   By:  Marijo Sanes M.D.   On: 12/14/2019 18:38       Assessment / Plan:    #24 64 year old white male with acute hepatitis/hyperbilirubinemia with jaundice- Parameters with gradual improvement   MRI/MRCP  negative for any evidence of biliary abnormality, liver appears normal other than small benign hepatic cysts, does have mild splenomegaly  CMV IgM positive-CMV PCR quant ordered and pending. 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  #2 intermittent atypical angioedema-cannot relate the description of these angioedema events to the transaminitis. Advised patient to follow-up with his PCP Dr. Silvio [name], obtain allergy consultation.  Advised Benadryl immediately for further episodes, may need a prescription for steroid Dosepak to use as needed and eventually EpiPen  Plan; okay for discharge today Patient plans to follow-up with Dr. Silvio [name] next week. He will make an appointment to see Dr. Earlean [name] for  follow-up  Needs repeat hepatic panel next week and then serially until normalization. We will contact patient once the additional CMV serology results.         Principal Problem:   Jaundice Active Problems:   BPH with obstruction/lower urinary tract symptoms   Elevated PSA   Elevated LFTs     LOS: 5 days   Filbert Craze EsterwoodPA-C  12/15/2019, 8:32 AM

## 2019-12-15 NOTE — Plan of Care (Signed)
Pt was discharged home today. Instructions were reviewed with patient, and questions were answered. Pt was taken to main entrance via wheelchair by NT.  

## 2019-12-15 NOTE — Discharge Instructions (Signed)
Jaundice, Adult  Jaundice is a yellowish discoloration of the skin, the whites of the eyes, and the mucous membranes. Jaundice can be a sign that the liver or the bile system is not working properly. What are the causes? This condition is caused by an increase in the level of bilirubin in the blood. Bilirubin is a substance that is produced by the normal breakdown of red blood cells. Conditions and activities that can cause an increase in the bilirubin level include:  Gallstones, if they block the bile ducts.  Alcoholic liver disease.  Other liver diseases, such as cirrhosis. Cirrhosis is scarring of the liver.  Certain cancers that may block the bile ducts, such as pancreatic cancer.  Certain infections, such as viral hepatitis.  Certain genetic syndromes, such as Gilbert syndrome.  Certain medicines, such as acetaminophen if used in excess. What are the signs or symptoms? Symptoms of this condition include:  Yellow color to the skin, the whites of the eyes, or the mucous membranes.  Dark brown urine.  Stomach pain.  Light or clay-colored stool.  Itchy skin (pruritus). How is this diagnosed? This condition is diagnosed with medical history, physical exam, blood tests, and imaging tests. You may have additional tests to check for other causes of increased bilirubin levels in your blood or to rule out other possible causes. How is this treated? Treatment for this condition depends on the cause of your jaundice and other underlying conditions. Treatment may include:  Stopping the use of certain medicines.  Giving fluids through an IV that is inserted into one of your veins.  Using medicines to treat pruritus.  Doing surgery, if there is blockage of the bile ducts. Follow these instructions at home:   Take over-the-counter and prescription medicines only as told by your health care provider.  Use skin lotion to relieve itching.  Drink plenty of fluid.  Do not drink  alcohol.  Keep all follow-up visits as told by your health care provider. This is important. Contact a health care provider if you have:  A fever.  Abdominal pain or swelling. Get help right away if:  Your symptoms suddenly get worse.  Your pain gets worse.  You vomit repeatedly.  You vomit blood.  You become weak or confused.  You develop a severe headache.  You have blood in your stool.  You become severely dehydrated. Signs of severe dehydration include: ? A very dry mouth. ? A rapid, weak pulse. ? Rapid breathing. ? Blue lips. Summary  Jaundice is a yellowish discoloration of the skin, the whites of the eyes, and the mucous membranes. Jaundice can be a sign that the liver or the bile system is not working properly.  Symptoms include a yellow color to the skin or eyes, dark urine, and a change in stool color.  Your health care provider will need to do further testing, including blood tests and imaging studies.  Get help right away if your symptoms suddenly get worse, you become weak and confused, you vomit, you have blood in your stool, you have a severe headache, or you become dehydrated. This information is not intended to replace advice given to you by your health care provider. Make sure you discuss any questions you have with your health care provider. Document Revised: 02/08/2017 Document Reviewed: 02/08/2017 Elsevier Patient Education  2020 [employer].

## 2019-12-15 NOTE — Telephone Encounter (Signed)
-----   Message from Alfredia Ferguson, PA-C sent at 12/15/2019 10:39 AM EDT ----- Regarding: labs Pt being D/C today - pt of Dr Silvio Pate  Has an acute hepatitis  Please put in orders for hepatic panel for next Monday - he would like to get las done at [facility] office . Let me know if problems with that -if needs to come to our office then will need to call him and let him know thanks

## 2019-12-16 ENCOUNTER — Telehealth: Payer: Self-pay

## 2019-12-16 NOTE — Telephone Encounter (Signed)
Spoke to [employer]. She said thank you very much.

## 2019-12-16 NOTE — Telephone Encounter (Signed)
Please let her know that I was certainly going to check a hepatic profile on Monday and they do not need to put in an order

## 2019-12-16 NOTE — Telephone Encounter (Signed)
Transition Care Management Unsuccessful Follow-up Telephone Call  Date of discharge and from where:  12/15/2019, [facility]  Attempts:  3rd Attempt  Reason for unsuccessful TCM follow-up call:  Left voice message, Voicemail box has been setup now, It was not setup with the previous calls

## 2019-12-16 NOTE — Telephone Encounter (Signed)
Transition Care Management Unsuccessful Follow-up Telephone Call  Date of discharge and from where:  12/15/2019, [facility]  Attempts:  2nd Attempt  Reason for unsuccessful TCM follow-up call:  Unable to leave message

## 2019-12-16 NOTE — Telephone Encounter (Signed)
Patient's wife called.  She spoke to Dr.Pyrtle's office and they said they can't get patient in for a follow up until December.  Patient was told the only way to move appointment up was for Dr.Letvak to contact GI. Mrs. Roylance can be contact at [phone] or [phone].

## 2019-12-16 NOTE — Telephone Encounter (Signed)
Transition Care Management Unsuccessful Follow-up Telephone Call  Date of discharge and from where:  12/15/2019, [name]  Attempts:  1st Attempt  Reason for unsuccessful TCM follow-up call:  Unable to leave message

## 2019-12-17 LAB — CMV DNA, QUANTITATIVE, PCR
CMV DNA Quant: POSITIVE IU/mL
Log10 CMV Qn DNA Pl: UNDETERMINED log10 IU/mL

## 2019-12-17 NOTE — Telephone Encounter (Signed)
Spoke to pt. He agrees with the plan. Will discuss at [place].

## 2019-12-17 NOTE — Telephone Encounter (Signed)
Please let her know that we can discuss this at his appointment--but there is probably nothing for GI to do anyway, so an appointment may not even be necessary

## 2019-12-21 ENCOUNTER — Ambulatory Visit: Payer: PRIVATE HEALTH INSURANCE | Admitting: Internal Medicine

## 2019-12-21 ENCOUNTER — Encounter: Payer: Self-pay | Admitting: Internal Medicine

## 2019-12-21 ENCOUNTER — Other Ambulatory Visit: Payer: Self-pay

## 2019-12-21 DIAGNOSIS — B179 Acute viral hepatitis, unspecified: Secondary | ICD-10-CM | POA: Insufficient documentation

## 2019-12-21 DIAGNOSIS — Z8619 Personal history of other infectious and parasitic diseases: Secondary | ICD-10-CM | POA: Insufficient documentation

## 2019-12-21 NOTE — Progress Notes (Signed)
Subjective:    Patient ID: Ronnie [name], male    DOB: 14-Mar-1955, 64 y.o.   MRN: [id number]  HPI Here for follow up of hospitalization for acute hepatitis This visit occurred during the SARS-CoV-2 public health emergency.  Safety protocols were in place, including screening questions prior to the visit, additional usage of staff PPE, and extensive cleaning of exam room while observing appropriate contact time as indicated for disinfecting solutions.   Started with illness with dark urine---~10/9 Then had ear swelling---?allergic reaction 10/13---ears to throat 10/14--had swelling in palms of hands Then to feet on the next day (one spot along medial arch) Then back to ears These swellings have occurred before---perhaps 3 times a year for 4 years or so Saw Dr Darnell Level at that point virtually 10/19 Labs done at urgent care 10/17 and results came back later Then got more sick---so to ER  Cholestatic jaundice pattern but ultrasound then CT scan both negative MRI and MRI cholangiogram also done Extensive blood testing---Hepatitis screen positive but quantitative RNA tests negative Also suggestion of CMV infection---but no measurable viral load  Home 10/26  Still with some itching and fatigue  No current outpatient medications on file prior to visit.   No current facility-administered medications on file prior to visit.    No Known Allergies  Past Medical History:  Diagnosis Date  . Back pain 2009   with radiculopathy. Fell off roof bulging L4/L5 and a comp fx L5/S1  . Ear lump   . Numbness and tingling of foot    Right  . 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 BACK SURGERY    . Bulging disc L4/L5, comp fx L5/S1  2009   Post falling off roof    Family History  Problem Relation Age of Onset  . Cancer Mother        Liver  . Heart disease Father        rheumatic heart disease, no CAD  . Heart disease  Paternal Uncle   . Heart disease Paternal Grandfather     Social History   Socioeconomic History  . Marital status: Married    Spouse name: Not on file  . Number of children: 2  . Years of education: Not on file  . Highest education level: Not on file  Occupational History  . 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 Smoking status: Never Smoker  . Smokeless tobacco: Never Used  Substance and Sexual Activity  . Alcohol use: Yes    Comment: rare beer  . Drug use: No  . Sexual activity: Not on file  Other Topics Concern  . Not on file  Social History Narrative   No regular exercise.   Social Determinants of Health   Financial Resource Strain:   . Difficulty of Paying Living Expenses: Not on file  Food Insecurity:   . Worried About Charity fundraiser in the Last Year: Not on file  . Ran Out of Food in the Last Year: Not on file  Transportation Needs:   . Lack of Transportation (Medical): Not on file  . Lack of Transportation (Non-Medical): Not on file  Physical Activity:   . Days of Exercise per Week: Not on file  . Minutes of Exercise per Session: Not on file  Stress:   . Feeling of Stress : Not on file  Social Connections:   .  Frequency of Communication with Friends and Family: Not on file  . Frequency of Social Gatherings with Friends and Family: Not on file  . Attends Religious Services: Not on file  . Active Member of Clubs or Organizations: Not on file  . Attends Archivist Meetings: Not on file  . Marital Status: Not on file  Intimate Partner Violence:   . Fear of Current or Ex-Partner: Not on file  . Emotionally Abused: Not on file  . Physically Abused: Not on file  . Sexually Abused: Not on file   Review of Systems Eating okay---gets weird taste in mouth about an hour later Has lost about 5-6# Sleeping okay No N/V now Bowels moving okay--but still light in color Urine is lightening in color    Objective:     Physical Exam Constitutional:      Appearance: Normal appearance.  Eyes:     Comments: Slight scleral icterus  Cardiovascular:     Rate and Rhythm: Normal rate and regular rhythm.     Heart sounds: No murmur heard.  No gallop.   Pulmonary:     Effort: Pulmonary effort is normal.     Breath sounds: Normal breath sounds. No wheezing or rales.  Musculoskeletal:     Cervical back: Neck supple.     Right lower leg: No edema.     Left lower leg: No edema.  Lymphadenopathy:     Cervical: No cervical adenopathy.  Skin:    Comments: Sallow skin ---mostly abdomen  Neurological:     Mental Status: He is alert.  Psychiatric:        Mood and Affect: Mood normal.        Behavior: Behavior normal.            Assessment & Plan:

## 2019-12-21 NOTE — Assessment & Plan Note (Signed)
Presumably viral but no clear etiology Unclear whether it could be related to the swelling symptoms he gets several times a year (?allergic reaction) Slowly improving---but still mild jaundice Feels better systemically  Will recheck labs today Should probably follow up with GI (may be next month) May want to consider allergy evaluation for the swelling symptoms----should seek emergency care once those symptoms recur No Rx for now

## 2019-12-22 ENCOUNTER — Other Ambulatory Visit: Payer: Self-pay

## 2019-12-22 ENCOUNTER — Telehealth: Payer: Self-pay

## 2019-12-22 DIAGNOSIS — R17 Unspecified jaundice: Secondary | ICD-10-CM

## 2019-12-22 DIAGNOSIS — B179 Acute viral hepatitis, unspecified: Secondary | ICD-10-CM

## 2019-12-22 LAB — HEPATIC FUNCTION PANEL
ALT: 74 U/L — ABNORMAL HIGH (ref 0–53)
AST: 54 U/L — ABNORMAL HIGH (ref 0–37)
Albumin: 3.8 g/dL (ref 3.5–5.2)
Alkaline Phosphatase: 337 U/L — ABNORMAL HIGH (ref 39–117)
Bilirubin, Direct: 2.2 mg/dL — ABNORMAL HIGH (ref 0.0–0.3)
Total Bilirubin: 4.7 mg/dL — ABNORMAL HIGH (ref 0.2–1.2)
Total Protein: 6.8 g/dL (ref 6.0–8.3)

## 2019-12-22 LAB — CBC
HCT: 42.3 % (ref 39.0–52.0)
Hemoglobin: 14.2 g/dL (ref 13.0–17.0)
MCHC: 33.7 g/dL (ref 30.0–36.0)
MCV: 87.5 fl (ref 78.0–100.0)
Platelets: 447 10*3/uL — ABNORMAL HIGH (ref 150.0–400.0)
RBC: 4.84 Mil/uL (ref 4.22–5.81)
RDW: 15.2 % (ref 11.5–15.5)
WBC: 8.2 10*3/uL (ref 4.0–10.5)

## 2019-12-22 LAB — RENAL FUNCTION PANEL
Albumin: 3.8 g/dL (ref 3.5–5.2)
BUN: 15 mg/dL (ref 6–23)
CO2: 29 mEq/L (ref 19–32)
Calcium: 9.2 mg/dL (ref 8.4–10.5)
Chloride: 101 mEq/L (ref 96–112)
Creatinine, Ser: 0.92 mg/dL (ref 0.40–1.50)
GFR: 88.42 mL/min (ref >60.00)
Glucose, Bld: 85 mg/dL (ref 70–99)
Phosphorus: 4.7 mg/dL — ABNORMAL HIGH (ref 2.3–4.6)
Potassium: 4.5 mEq/L (ref 3.5–5.1)
Sodium: 137 mEq/L (ref 135–145)

## 2019-12-22 LAB — SEDIMENTATION RATE: Sed Rate: 24 mm/hr — ABNORMAL HIGH (ref 0–20)

## 2019-12-22 NOTE — Telephone Encounter (Signed)
-----   Message from [place], MD sent at 12/21/2019  6:12 PM EDT ----- This was a patient I saw in the hospital for jaundice.  Please arrange a hepatic function panel earl next week and a clinic visit with me in early December. We will continue to check the liver labs periodically until the visit, frequency TBD by results.  - HD

## 2019-12-22 NOTE — Telephone Encounter (Signed)
Left message for patient to return call for Dr. Loletha Carrow' recommendations and appointment details.  Patient has been scheduled for a follow up on 01/07/20 at 8:40 AM.  Lab order and reminder in epic.

## 2019-12-22 NOTE — Telephone Encounter (Signed)
Spoke with patient in regards to recommendations per Dr. Loletha Carrow. Patient is aware of appt and the need for labs early next week. Patient states that it is okay to remind him via My Chart, provided patient with office address and lab hours and information. Patient is aware that we will determine frequency of labs once they have been reviewed by Dr. Loletha Carrow.  Patient verbalized understanding of all information and had no concerns at the end of the call.

## 2019-12-28 ENCOUNTER — Telehealth: Payer: Self-pay

## 2019-12-28 ENCOUNTER — Other Ambulatory Visit: Payer: PRIVATE HEALTH INSURANCE

## 2019-12-28 DIAGNOSIS — B179 Acute viral hepatitis, unspecified: Secondary | ICD-10-CM

## 2019-12-28 DIAGNOSIS — R17 Unspecified jaundice: Secondary | ICD-10-CM

## 2019-12-28 LAB — HEPATIC FUNCTION PANEL
ALT: 54 U/L — ABNORMAL HIGH (ref 0–53)
AST: 39 U/L — ABNORMAL HIGH (ref 0–37)
Albumin: 3.9 g/dL (ref 3.5–5.2)
Alkaline Phosphatase: 276 U/L — ABNORMAL HIGH (ref 39–117)
Bilirubin, Direct: 1.5 mg/dL — ABNORMAL HIGH (ref 0.0–0.3)
Total Bilirubin: 3.2 mg/dL — ABNORMAL HIGH (ref 0.2–1.2)
Total Protein: 6.6 g/dL (ref 6.0–8.3)

## 2019-12-28 NOTE — Telephone Encounter (Signed)
-----   Message from Yevette Edwards, RN sent at 12/22/2019  8:27 AM EDT ----- Regarding: Labs Hepatic function panel, order in epic - remind via My Chart

## 2019-12-28 NOTE — Telephone Encounter (Signed)
Reminded patient via My Chart

## 2019-12-29 ENCOUNTER — Other Ambulatory Visit: Payer: Self-pay

## 2019-12-29 DIAGNOSIS — R17 Unspecified jaundice: Secondary | ICD-10-CM

## 2019-12-29 DIAGNOSIS — B179 Acute viral hepatitis, unspecified: Secondary | ICD-10-CM

## 2020-01-04 ENCOUNTER — Other Ambulatory Visit: Payer: PRIVATE HEALTH INSURANCE

## 2020-01-04 ENCOUNTER — Telehealth: Payer: Self-pay

## 2020-01-04 DIAGNOSIS — B179 Acute viral hepatitis, unspecified: Secondary | ICD-10-CM

## 2020-01-04 DIAGNOSIS — R17 Unspecified jaundice: Secondary | ICD-10-CM

## 2020-01-04 LAB — HEPATIC FUNCTION PANEL
ALT: 45 U/L (ref 0–53)
AST: 33 U/L (ref 0–37)
Albumin: 3.9 g/dL (ref 3.5–5.2)
Alkaline Phosphatase: 239 U/L — ABNORMAL HIGH (ref 39–117)
Bilirubin, Direct: 1 mg/dL — ABNORMAL HIGH (ref 0.0–0.3)
Total Bilirubin: 2.4 mg/dL — ABNORMAL HIGH (ref 0.2–1.2)
Total Protein: 6.9 g/dL (ref 6.0–8.3)

## 2020-01-04 NOTE — Telephone Encounter (Signed)
-----   Message from Yevette Edwards, RN sent at 12/29/2019  8:40 AM EST ----- Regarding: Labs Repeat hepatic function panel, order in epic

## 2020-01-04 NOTE — Telephone Encounter (Signed)
Spoke with patient to remind him that he is due for repeat labs either today or tomorrow. Advised that no appt is necessary and that he can stop by at his convenience between 7:30 AM - 5 PM. Patient verbalized understanding and had no concerns at the end of the call.   My Chart message sent as well.

## 2020-01-07 ENCOUNTER — Telehealth: Payer: Self-pay | Admitting: *Deleted

## 2020-01-07 ENCOUNTER — Ambulatory Visit: Payer: PRIVATE HEALTH INSURANCE | Admitting: Gastroenterology

## 2020-01-07 ENCOUNTER — Encounter: Payer: Self-pay | Admitting: Gastroenterology

## 2020-01-07 VITALS — BP 120/70 | HR 86 | Ht 71.0 in | Wt 191.0 lb

## 2020-01-07 DIAGNOSIS — R7989 Other specified abnormal findings of blood chemistry: Secondary | ICD-10-CM | POA: Diagnosis not present

## 2020-01-07 NOTE — Progress Notes (Signed)
GI Progress Note  Chief Complaint: Jaundice  Subjective  History: [name] follows up after his recent hospitalization for jaundice.  He had generalized fatigue and slow development of painless jaundice, was admitted to the hospital about a week last month, extensive work-up ruled out obstruction, negative autoimmune labs.  He had a positive CMV IgM antibody, but negative titers.  The entire clinical picture was puzzling, not typical for acute CMV hepatitis.  Years of what sounded like intermittent angioedema that would typically occur on the face, felt to be unrelated to the liver condition.  No apparent medication triggers.  His liver labs have slowly decreased as we have followed him in the last few weeks.  [facility]nie [name] has been feeling well since I last saw him.  His fatigue is much improved, he denies abdominal pain, his appetite is good and weight stable. He recalls that the angioedema that really brought him to the hospital that time was worse than most others he has had, involving lips years and areas on the torso and legs.  ROS: Cardiovascular:  no chest pain Respiratory: no dyspnea  The patient's Past Medical, Family and Social History were reviewed and are on file in the EMR.  Objective:  Med list reviewed No current outpatient medications on file.   Vital signs in last 24 hrs: Vitals:   01/07/20 0838  BP: 120/70  Pulse: 86  SpO2: 98%    Physical Exam  Well-appearing  HEENT: sclera anicteric, oral mucosa moist without lesions  Neck: supple, no thyromegaly, JVD or lymphadenopathy  Cardiac: RRR without murmurs, S1S2 heard, no peripheral edema  Pulm: clear to auscultation bilaterally, normal RR and effort noted  Abdomen: soft, no tenderness, with active bowel sounds. No guarding or palpable hepatosplenomegaly.  Skin; warm and dry, mild jaundice, no rash  Labs:   CMP Latest Ref Rng & Units 01/04/2020 12/28/2019 12/21/2019  Glucose 70 - 99 mg/dL - -  85  BUN 6 - 23 mg/dL - - 15  Creatinine 0.40 - 1.50 mg/dL - - 0.92  Sodium 135 - 145 mEq/L - - 137  Potassium 3.5 - 5.1 mEq/L - - 4.5  Chloride 96 - 112 mEq/L - - 101  CO2 19 - 32 mEq/L - - 29  Calcium 8.4 - 10.5 mg/dL - - 9.2  Total Protein 6.0 - 8.3 g/dL 6.9 6.6 6.8  Total Bilirubin 0.2 - 1.2 mg/dL 2.4(H) 3.2(H) 4.7(H)  Alkaline Phos 39 - 117 U/L 239(H) 276(H) 337(H)  AST 0 - 37 U/L 33 39(H) 54(H)  ALT 0 - 53 U/L 45 54(H) 74(H)     ___________________________________________ Radiologic studies:  CLINICAL DATA:  Abdominal pain. Possible biliary obstruction. Hyperbilirubinemia.   EXAM: MRI ABDOMEN WITHOUT AND WITH CONTRAST (INCLUDING MRCP)   TECHNIQUE: Multiplanar multisequence MR imaging of the abdomen was performed both before and after the administration of intravenous contrast. Heavily T2-weighted images of the biliary and pancreatic ducts were obtained, and three-dimensional MRCP images were rendered by post processing.   CONTRAST:  39mL GADAVIST GADOBUTROL 1 MMOL/ML IV SOLN   COMPARISON:  CT scan and ultrasound 12/09/2019   FINDINGS: Lower chest: No significant findings in the lower chest. No pleural effusions or pulmonary lesions. No pericardial effusion.   Hepatobiliary: No morphologic findings to suggest cirrhosis. The portal and hepatic veins are patent. Several small scattered nonenhancing hepatic cysts are noted. No worrisome hepatic lesions or intrahepatic biliary dilatation. No definite gallstones or findings for acute cholecystitis. Normal caliber and course  of the common bile duct. No common bile duct stones.   Pancreas:  No mass, inflammation or ductal dilatation.   Spleen: Mild splenomegaly. Spleen measures 14 x 13 x 11 cm. No focal lesions.   Adrenals/Urinary Tract: Small renal cysts. No worrisome renal lesions or hydronephrosis.   Stomach/Bowel: Visualized portions within the abdomen are unremarkable.   Vascular/Lymphatic: No pathologically  enlarged lymph nodes identified. No abdominal aortic aneurysm demonstrated.   Other:  No ascites or abdominal wall hernia.   Musculoskeletal: No significant bony findings.   IMPRESSION: 1. Normal pancreaticobiliary tree. No gallstones or common bile duct stones. 2. No morphologic findings to suggest cirrhosis. Mild splenomegaly. 3. Small hepatic and renal cysts.     Electronically Signed   By: Marijo Sanes M.D.   On: 12/14/2019 18:38  ____________________________________________ Other:   _____________________________________________ Assessment & Plan  Assessment: Encounter Diagnosis  Name Primary?  . Elevated LFTs Yes   Severe painless cholestatic jaundice of unknown cause.  Acute CMV just does not seem to account for this, but it is possible he had some other unidentified viral infection.  He was on no medicines or OTC or herbal supplements. He had not previously had a similar episode and has been recovering since.  I am not aware of an allergic or angioedema cause that would do this, and his eosinophil counts were normal on admission.  He had several questions, all answered to his satisfaction.  He was glad to hear that I expect this will gradually improve with normalization of liver labs.  When it does, we will check in periodically afterwards.   I also recommended an allergy/immunology consultation regarding his recurrent angioedema.  Plan: Hepatic function panel in 2 weeks, then periodically afterward.  Referral to Dr. Salvatore Marvel of allergy/immunology for recurrent angioedema.   20 minutes were spent on this encounter (including chart review, history/exam, counseling/coordination of care, and documentation)  [name]

## 2020-01-07 NOTE — Telephone Encounter (Signed)
Patient has a referral to Salvatore Marvel to Allergy/Immunology scheduled on 02/25/2020 at 9:30am , they will mail patient all the paperwork   [address]  [phone]

## 2020-01-07 NOTE — Patient Instructions (Signed)
You will need labs drawn in 2 weeks ([facility]t'S) come to the basement to our lab on 01/21/2020 anytime  Between the hours 8 am-5pm  We will refer you to [facility] and we will contact you with that appointment   If you are age 63 or older, your body mass index should be between 23-30. Your Body mass index is 26.64 kg/m. If this is out of the aforementioned range listed, please consider follow up with your Primary Care Provider.  If you are age 49 or younger, your body mass index should be between 19-25. Your Body mass index is 26.64 kg/m. If this is out of the aformentioned range listed, please consider follow up with your Primary Care Provider.    Due to recent changes in healthcare laws, you may see the results of your imaging and laboratory studies on MyChart before your provider has had a chance to review them.  We understand that in some cases there may be results that are confusing or concerning to you. Not all laboratory results come back in the same time frame and the provider may be waiting for multiple results in order to interpret others.  Please give [place] 48 hours in order for your provider to thoroughly review all the results before contacting the office for clarification of your results.   Thank you for choosing [facility]  Wilfrid Lund, MD

## 2020-01-18 ENCOUNTER — Other Ambulatory Visit: Payer: PRIVATE HEALTH INSURANCE

## 2020-01-18 DIAGNOSIS — R7989 Other specified abnormal findings of blood chemistry: Secondary | ICD-10-CM

## 2020-01-18 LAB — HEPATIC FUNCTION PANEL
ALT: 28 U/L (ref 0–53)
AST: 25 U/L (ref 0–37)
Albumin: 3.9 g/dL (ref 3.5–5.2)
Alkaline Phosphatase: 166 U/L — ABNORMAL HIGH (ref 39–117)
Bilirubin, Direct: 0.5 mg/dL — ABNORMAL HIGH (ref 0.0–0.3)
Total Bilirubin: 1.3 mg/dL — ABNORMAL HIGH (ref 0.2–1.2)
Total Protein: 6.8 g/dL (ref 6.0–8.3)

## 2020-01-21 ENCOUNTER — Other Ambulatory Visit: Payer: Self-pay

## 2020-01-21 DIAGNOSIS — R7989 Other specified abnormal findings of blood chemistry: Secondary | ICD-10-CM

## 2020-02-22 ENCOUNTER — Other Ambulatory Visit: Payer: PRIVATE HEALTH INSURANCE

## 2020-02-22 ENCOUNTER — Other Ambulatory Visit: Payer: Self-pay

## 2020-02-22 ENCOUNTER — Telehealth: Payer: Self-pay

## 2020-02-22 DIAGNOSIS — R7989 Other specified abnormal findings of blood chemistry: Secondary | ICD-10-CM

## 2020-02-22 LAB — HEPATIC FUNCTION PANEL
ALT: 24 U/L (ref 0–53)
AST: 22 U/L (ref 0–37)
Albumin: 4.3 g/dL (ref 3.5–5.2)
Alkaline Phosphatase: 125 U/L — ABNORMAL HIGH (ref 39–117)
Bilirubin, Direct: 0.3 mg/dL (ref 0.0–0.3)
Total Bilirubin: 1.1 mg/dL (ref 0.2–1.2)
Total Protein: 6.8 g/dL (ref 6.0–8.3)

## 2020-02-22 NOTE — Telephone Encounter (Signed)
-----   Message from Missy Sabins, RN sent at 01/21/2020  3:08 PM EST ----- Regarding: LABS Repeat hepatic function panel, order in epic

## 2020-02-22 NOTE — Telephone Encounter (Signed)
Spoke with patient to remind him that he is due for repeat labs this week. Advised that no appt is necessary so that he can stop by the lab at his convenience between 7:30 AM - 5 PM. Patient states that he is in [place] today and will try to stop by.

## 2020-02-25 ENCOUNTER — Ambulatory Visit: Payer: Self-pay | Admitting: Allergy & Immunology

## 2020-03-24 ENCOUNTER — Telehealth: Payer: Self-pay

## 2020-03-24 NOTE — Telephone Encounter (Signed)
Lm on vm for patient to return call 

## 2020-03-24 NOTE — Telephone Encounter (Signed)
Spoke with patient to remind him that he is due for repeat labs at this time. Patient is aware that he can stop by the lab at his convenience between 7:30 AM - 5 PM, he is aware that no appointment is necessary. Patient verbalized understanding and had no concerns at the end of the call

## 2020-03-24 NOTE — Telephone Encounter (Signed)
-----   Message from Yevette Edwards, RN sent at 02/22/2020  2:09 PM EST ----- Regarding: Labs Repeat hepatic function panel, order in epic

## 2020-03-28 ENCOUNTER — Other Ambulatory Visit: Payer: PRIVATE HEALTH INSURANCE

## 2020-03-28 DIAGNOSIS — R7989 Other specified abnormal findings of blood chemistry: Secondary | ICD-10-CM

## 2020-03-28 LAB — HEPATIC FUNCTION PANEL
ALT: 20 U/L (ref 0–53)
AST: 20 U/L (ref 0–37)
Albumin: 4.1 g/dL (ref 3.5–5.2)
Alkaline Phosphatase: 117 U/L (ref 39–117)
Bilirubin, Direct: 0.2 mg/dL (ref 0.0–0.3)
Total Bilirubin: 0.8 mg/dL (ref 0.2–1.2)
Total Protein: 6.8 g/dL (ref 6.0–8.3)

## 2020-06-28 ENCOUNTER — Ambulatory Visit: Payer: PRIVATE HEALTH INSURANCE | Admitting: Internal Medicine

## 2020-06-28 ENCOUNTER — Encounter: Payer: Self-pay | Admitting: Internal Medicine

## 2020-06-28 ENCOUNTER — Other Ambulatory Visit: Payer: Self-pay

## 2020-06-28 DIAGNOSIS — R42 Dizziness and giddiness: Secondary | ICD-10-CM | POA: Diagnosis not present

## 2020-06-28 MED ORDER — MECLIZINE HCL 25 MG PO TABS: 25.0000 mg | ORAL_TABLET | Freq: Three times a day (TID) | ORAL | 1 refills | Status: DC | PRN

## 2020-06-28 NOTE — Progress Notes (Signed)
Subjective:    Patient ID: Ronnie [name], male    DOB: 1955/03/28, 65 y.o.   MRN: [id number]  HPI Here due to right ear fullness and dizziness This visit occurred during the SARS-CoV-2 public health emergency.  Safety protocols were in place, including screening questions prior to the visit, additional usage of staff PPE, and extensive cleaning of exam room while observing appropriate contact time as indicated for disinfecting solutions.   Has sense that right ear is clogged Also noted some dizziness---with some nausea Started 9 days ago--noted it at church. Dizzy and nausea and ear felt clogged Has recurred once---and he had to lie down Dizziness is right now--but no ear clogging (stays for days at a time) Notices it particularly if he lies down and turns Also notes with head movement  Some spells of blurry vision--loses focus Goes back 2 years or so Sees black spots--go away after 30 minutes No headaches  No current outpatient medications on file prior to visit.   No current facility-administered medications on file prior to visit.    No Known Allergies  Past Medical History:  Diagnosis Date  . Back pain 2009   with radiculopathy. Fell off roof bulging L4/L5 and a comp fx L5/S1  . Ear lump   . Elevated liver function tests   . Enlarged prostate   . Hepatic cyst   . Jaundice   . Numbness and tingling of foot    Right  . Numerous moles    Has always had multiple nevi  . Renal cyst   . Splenomegaly     Past Surgical History:  Procedure Laterality Date  . BACK SURGERY    . Bulging disc L4/L5, comp fx L5/S1  2009   Post falling off roof  . COLONOSCOPY  2017   [facility]     Family History  Problem Relation Age of Onset  . Cancer Mother        Liver  . Heart disease Father        rheumatic heart disease, no CAD  . Heart disease Paternal Uncle   . Heart disease Paternal Grandfather   . Colon cancer Neg Hx   . Pancreatic cancer Neg Hx   . Esophageal cancer  Neg Hx   . Liver disease Neg Hx   . Liver cancer Neg Hx     Social History   Socioeconomic History  . Marital status: Married    Spouse name: Not on file  . Number of children: 2  . Years of education: Not on file  . Highest education level: Not on file  Occupational History  . 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 Smoking status: Never Smoker  . Smokeless tobacco: Never Used  Substance and Sexual Activity  . Alcohol use: Yes    Comment: rare beer  . Drug use: No  . Sexual activity: Not on file  Other Topics Concern  . Not on file  Social History Narrative   No regular exercise.   Social Determinants of Health   Financial Resource Strain: Not on file  Food Insecurity: Not on file  Transportation Needs: Not on file  Physical Activity: Not on file  Stress: Not on file  Social Connections: Not on file  Intimate Partner Violence: Not on file   Review of Systems No headaches now--just "feels foggy" in frontal area No tinnitus in 9 months Hearing is stable--no problems No trouble walking No vision changes or  loss of vision Slight metallic taste in mouth--if he drinks soda    Objective:   Physical Exam Constitutional:      Appearance: Normal appearance.  HENT:     Right Ear: Tympanic membrane and ear canal normal.     Left Ear: Tympanic membrane and ear canal normal.  Eyes:     Extraocular Movements: Extraocular movements intact.     Pupils: Pupils are equal, round, and reactive to light.     Comments: ? Very slight horizontal nystagmus with left gaze  Musculoskeletal:     Cervical back: Neck supple.  Lymphadenopathy:     Cervical: No cervical adenopathy.  Neurological:     Mental Status: He is alert and oriented to person, place, and time.     Cranial Nerves: No cranial nerve deficit.     Motor: No weakness, tremor or abnormal muscle tone.     Coordination: Romberg sign negative. Coordination normal. Finger-Nose-Finger  Test normal.     Gait: Gait normal.            Assessment & Plan:

## 2020-06-28 NOTE — Assessment & Plan Note (Signed)
Associated with ear fullness and nausea Seems to be related to inner ear Nothing to suggest brainstem stroke Low possibility of acoustic neuroma--but no neurologic findings  Will try meclizine 25 tid prn ENT evaluation if persists--vs checking MRI

## 2020-09-07 ENCOUNTER — Encounter: Payer: PRIVATE HEALTH INSURANCE | Admitting: Internal Medicine

## 2020-09-28 ENCOUNTER — Encounter: Payer: Self-pay | Admitting: Internal Medicine

## 2020-09-28 ENCOUNTER — Other Ambulatory Visit: Payer: Self-pay

## 2020-09-28 ENCOUNTER — Ambulatory Visit: Payer: PRIVATE HEALTH INSURANCE | Admitting: Internal Medicine

## 2020-09-28 VITALS — BP 118/78 | HR 62 | Temp 98.2°F | Ht 71.0 in | Wt 201.0 lb

## 2020-09-28 DIAGNOSIS — N401 Enlarged prostate with lower urinary tract symptoms: Secondary | ICD-10-CM

## 2020-09-28 DIAGNOSIS — R972 Elevated prostate specific antigen [PSA]: Secondary | ICD-10-CM

## 2020-09-28 DIAGNOSIS — B179 Acute viral hepatitis, unspecified: Secondary | ICD-10-CM | POA: Diagnosis not present

## 2020-09-28 DIAGNOSIS — N138 Other obstructive and reflux uropathy: Secondary | ICD-10-CM

## 2020-09-28 DIAGNOSIS — Z0000 Encounter for general adult medical examination without abnormal findings: Secondary | ICD-10-CM | POA: Diagnosis not present

## 2020-09-28 LAB — RENAL FUNCTION PANEL
Albumin: 4.3 g/dL (ref 3.5–5.2)
BUN: 15 mg/dL (ref 6–23)
CO2: 27 mEq/L (ref 19–32)
Calcium: 9.1 mg/dL (ref 8.4–10.5)
Chloride: 101 mEq/L (ref 96–112)
Creatinine, Ser: 1.02 mg/dL (ref 0.40–1.50)
GFR: 77.7 mL/min (ref >60.00)
Glucose, Bld: 84 mg/dL (ref 70–99)
Phosphorus: 3.5 mg/dL (ref 2.3–4.6)
Potassium: 4.4 mEq/L (ref 3.5–5.1)
Sodium: 136 mEq/L (ref 135–145)

## 2020-09-28 LAB — HEPATIC FUNCTION PANEL
ALT: 18 U/L (ref 0–53)
AST: 22 U/L (ref 0–37)
Albumin: 4.3 g/dL (ref 3.5–5.2)
Alkaline Phosphatase: 104 U/L (ref 39–117)
Bilirubin, Direct: 0.2 mg/dL (ref 0.0–0.3)
Total Bilirubin: 1.3 mg/dL — ABNORMAL HIGH (ref 0.2–1.2)
Total Protein: 6.8 g/dL (ref 6.0–8.3)

## 2020-09-28 LAB — CBC
HCT: 46.1 % (ref 39.0–52.0)
Hemoglobin: 15.7 g/dL (ref 13.0–17.0)
MCHC: 34.2 g/dL (ref 30.0–36.0)
MCV: 88.5 fl (ref 78.0–100.0)
Platelets: 217 10*3/uL (ref 150.0–400.0)
RBC: 5.21 Mil/uL (ref 4.22–5.81)
RDW: 13.7 % (ref 11.5–15.5)
WBC: 7.4 10*3/uL (ref 4.0–10.5)

## 2020-09-28 LAB — PSA: PSA: 9 ng/mL — ABNORMAL HIGH (ref 0.10–4.00)

## 2020-09-28 NOTE — Assessment & Plan Note (Signed)
5-6 in the past couple of years Will recheck

## 2020-09-28 NOTE — Assessment & Plan Note (Signed)
Mild symptoms Will consider tamsulosin if worsens

## 2020-09-28 NOTE — Assessment & Plan Note (Signed)
Healthy Colon due in December Recommended COVID covalent as his booster Recommended flu vaccine--he will consider He isn't excited about shingrix

## 2020-09-28 NOTE — Assessment & Plan Note (Signed)
Seems to be resolved Will recheck labs

## 2020-09-28 NOTE — Progress Notes (Signed)
Subjective:    Patient ID: Ronnie [name], male    DOB: [birth year], 65 y.o.   MRN: [id number]  HPI Here for physical This visit occurred during the SARS-CoV-2 public health emergency.  Safety protocols were in place, including screening questions prior to the visit, additional usage of staff PPE, and extensive cleaning of exam room while observing appropriate contact time as indicated for disinfecting solutions.   No concerns Liver tests did finally come back to normal  No current outpatient medications on file prior to visit.   No current facility-administered medications on file prior to visit.    No Known Allergies  Past Medical History:  Diagnosis Date   Back pain 2009   with radiculopathy. Fell off roof bulging L4/L5 and a comp fx L5/S1   Ear lump    Elevated liver function tests    Enlarged prostate    Hepatic cyst    Jaundice    Numbness and tingling of foot    Right   Numerous moles    Has always had multiple nevi   Renal cyst    Splenomegaly     Past Surgical History:  Procedure Laterality Date   BACK SURGERY     Bulging disc L4/L5, comp fx L5/S1  2009   Post falling off roof   COLONOSCOPY  2017   [facility]     Family History  Problem Relation Age of Onset   Cancer Mother        Liver   Heart disease Father        rheumatic heart disease, no CAD   Heart disease Paternal Uncle    Heart disease Paternal Grandfather    Colon cancer Neg Hx    Pancreatic cancer Neg Hx    Esophageal cancer Neg Hx    Liver disease Neg Hx    Liver cancer Neg Hx     Social History   Socioeconomic History   Marital status: Married    Spouse name: Not on file   Number of children: 2   Years of education: Not on file   Highest education level: Not on file  Occupational History   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   Social Determinants of Health   Financial Resource Strain: Not on file  Food Insecurity: Not on file  Transportation Needs: Not on file  Physical Activity: Not on file  Stress: Not on file  Social Connections: Not on file  Intimate Partner Violence: Not on file   Review of Systems  Constitutional:  Negative for fatigue.       Stays active with yard work, Social research officer, government Weight up slightly Wears seat belt  HENT:  Negative for dental problem, hearing loss and tinnitus.        Keeps up with dentist  Eyes:  Negative for visual disturbance.       No diplopia or unilateral vision loss  Respiratory:  Negative for cough, chest tightness and shortness of breath.   Cardiovascular:  Negative for chest pain, palpitations and leg swelling.  Gastrointestinal:  Negative for blood in stool and constipation.  No heartburn  Endocrine: Negative for polydipsia and polyuria.  Genitourinary:        Flow is a bit slow----some increased frequency No sexual problems  Musculoskeletal:  Negative for back pain and joint swelling.       Mild knee pain  Skin:  Negative for rash.  Allergic/Immunologic: Negative for environmental allergies and immunocompromised state.  Neurological:  Negative for dizziness, syncope, light-headedness and headaches.  Hematological:  Negative for adenopathy. Does not bruise/bleed easily.  Psychiatric/Behavioral:  Negative for dysphoric mood and sleep disturbance. The patient is not nervous/anxious.       Objective:   Physical Exam Constitutional:      Appearance: Normal appearance.  HENT:     Right Ear: Tympanic membrane and ear canal normal.     Left Ear: Tympanic membrane and ear canal normal.     Mouth/Throat:     Pharynx: No oropharyngeal exudate or posterior oropharyngeal erythema.  Eyes:     Conjunctiva/sclera: Conjunctivae  normal.     Pupils: Pupils are equal, round, and reactive to light.  Cardiovascular:     Rate and Rhythm: Normal rate and regular rhythm.     Pulses: Normal pulses.     Heart sounds: No murmur heard.   No gallop.  Pulmonary:     Effort: Pulmonary effort is normal.     Breath sounds: Normal breath sounds. No wheezing or rales.  Abdominal:     Palpations: Abdomen is soft. There is no mass.     Tenderness: There is no abdominal tenderness.  Musculoskeletal:     Cervical back: Neck supple.     Right lower leg: No edema.     Left lower leg: No edema.  Lymphadenopathy:     Cervical: No cervical adenopathy.  Skin:    General: Skin is warm.     Findings: No rash.  Neurological:     General: No focal deficit present.     Mental Status: He is alert and oriented to person, place, and time.  Psychiatric:        Mood and Affect: Mood normal.        Behavior: Behavior normal.           Assessment & Plan:

## 2021-01-08 ENCOUNTER — Encounter (HOSPITAL_COMMUNITY): Payer: Self-pay | Admitting: Emergency Medicine

## 2021-01-08 ENCOUNTER — Emergency Department (HOSPITAL_COMMUNITY)
Admission: EM | Admit: 2021-01-08 | Discharge: 2021-01-08 | Disposition: A | Discharge status: Home / Self Care | Payer: PRIVATE HEALTH INSURANCE | Attending: Student | Admitting: Student

## 2021-01-08 ENCOUNTER — Emergency Department (HOSPITAL_COMMUNITY): Payer: PRIVATE HEALTH INSURANCE

## 2021-01-08 ENCOUNTER — Other Ambulatory Visit: Payer: Self-pay

## 2021-01-08 DIAGNOSIS — W230XXA Caught, crushed, jammed, or pinched between moving objects, initial encounter: Secondary | ICD-10-CM | POA: Insufficient documentation

## 2021-01-08 DIAGNOSIS — S61217A Laceration without foreign body of left little finger without damage to nail, initial encounter: Secondary | ICD-10-CM | POA: Diagnosis not present

## 2021-01-08 DIAGNOSIS — Z23 Encounter for immunization: Secondary | ICD-10-CM | POA: Diagnosis not present

## 2021-01-08 DIAGNOSIS — S6992XA Unspecified injury of left wrist, hand and finger(s), initial encounter: Secondary | ICD-10-CM | POA: Diagnosis present

## 2021-01-08 DIAGNOSIS — S62637B Displaced fracture of distal phalanx of left little finger, initial encounter for open fracture: Secondary | ICD-10-CM | POA: Diagnosis not present

## 2021-01-08 DIAGNOSIS — S61219A Laceration without foreign body of unspecified finger without damage to nail, initial encounter: Secondary | ICD-10-CM

## 2021-01-08 MED ORDER — CEPHALEXIN 250 MG PO CAPS
500.0000 mg | ORAL_CAPSULE | Freq: Once | ORAL | Status: AC
  Administered 2021-01-08: 500 mg via ORAL
  Filled 2021-01-08: qty 2

## 2021-01-08 MED ORDER — CEPHALEXIN 500 MG PO CAPS: 500.0000 mg | ORAL_CAPSULE | Freq: Four times a day (QID) | ORAL | 0 refills | Status: AC

## 2021-01-08 MED ORDER — TETANUS-DIPHTH-ACELL PERTUSSIS 5-2.5-18.5 LF-MCG/0.5 IM SUSY
0.5000 mL | PREFILLED_SYRINGE | Freq: Once | INTRAMUSCULAR | Status: AC
  Administered 2021-01-08: 0.5 mL via INTRAMUSCULAR
  Filled 2021-01-08: qty 0.5

## 2021-01-08 MED ORDER — LIDOCAINE HCL (PF) 1 % IJ SOLN
5.0000 mL | Freq: Once | INTRAMUSCULAR | Status: AC
  Administered 2021-01-08: 5 mL via INTRADERMAL
  Filled 2021-01-08: qty 5

## 2021-01-08 MED ORDER — CEPHALEXIN 500 MG PO CAPS: 500.0000 mg | ORAL_CAPSULE | Freq: Four times a day (QID) | ORAL | 0 refills | Status: DC

## 2021-01-08 NOTE — ED Notes (Signed)
Pt verbalized understanding of d/c instructions, meds and followup care. Denies questions. VSS, no distress noted. Steady gait to exit with all belongings.  ?

## 2021-01-08 NOTE — ED Triage Notes (Addendum)
Large laceration to L pinky finger with nail involvement from a fan on a vacuum cleaner approx 20 min ago.  Unknown DT.

## 2021-01-08 NOTE — Discharge Instructions (Addendum)
You were seen today for evaluation of a finger injury and laceration.  Unfortunately, the bone of your pinky tip does have a fracture.  We were able to sew up the laceration down the side of your finger and on your nailbed.  We did remove that nail, please leave it in until you follow-up with Ortho.  I have sent in a referral to [facility], and they should call you to set that up soon.  I attached some instructions here for the care of your.  Please leave the dressing on for at least 24 hours, after that if you would like you may take it off gently rinse with soap and water and reapply a dressing.  I recommend use of some type of ointment such as Neosporin or Aquaphor to keep the area moist until you follow-up with your help.  You can use Motrin and Tylenol as needed for your pain.  Given the fact that you had quite an extensive laceration along with the fracture of the bone, I have also sent you in a prescription for an antibiotic.  You will take this 4 times daily for 10 days.  I hope you heal quickly!

## 2021-01-08 NOTE — ED Provider Notes (Signed)
[facility] EMERGENCY DEPARTMENT Provider Note   CSN: [id number] Arrival date & time: 01/08/21  1257     History Chief Complaint  Patient presents with   finger laceration    Ronnie [name] is a 65 y.o. male presents ED ED for evaluation of an injury to his left pinky finger just prior to arrival.  Patient states he was trying to fix a fan from the vacuum cleaner when his pinky got caught in a fan.  Hemorrhage was reasonably controlled with pressure. Patient is previously healthy with no new medical problems.  He is not on any daily medications.  Patient denies all other symptoms.  HPI     Past Medical History:  Diagnosis Date   Back pain 2009   with radiculopathy. Fell off roof bulging L4/L5 and a comp fx L5/S1   Ear lump    Elevated liver function tests    Enlarged prostate    Hepatic cyst    Jaundice    Numbness and tingling of foot    Right   Numerous moles    Has always had multiple nevi   Renal cyst    Splenomegaly     Patient Active Problem List   Diagnosis Date Noted   Acute hepatitis 12/21/2019   BPH with obstruction/lower urinary tract symptoms 09/03/2019   Elevated PSA 09/03/2019   Preventative health care 09/01/2018   NEVI, MULTIPLE 02/02/2009    Past Surgical History:  Procedure Laterality Date   BACK SURGERY     Bulging disc L4/L5, comp fx L5/S1  2009   Post falling off roof   COLONOSCOPY  2017   [facility]        Family History  Problem Relation Age of Onset   Cancer Mother        Liver   Heart disease Father        rheumatic heart disease, no CAD   Heart disease Paternal Uncle    Heart disease Paternal Grandfather    Colon cancer Neg Hx    Pancreatic cancer Neg Hx    Esophageal cancer Neg Hx    Liver disease Neg Hx    Liver cancer Neg Hx     Social History   Tobacco Use   Smoking status: Never   Smokeless tobacco: Never  Substance Use Topics   Alcohol use: Yes    Comment: rare beer   Drug use: No     Home Medications Prior to Admission medications   Not on File    Allergies    Patient has no known allergies.  Review of Systems   Review of Systems  Constitutional:  Negative for fever.  HENT: Negative.    Eyes: Negative.   Respiratory:  Negative for shortness of breath.   Cardiovascular: Negative.   Gastrointestinal:  Negative for abdominal pain and vomiting.  Endocrine: Negative.   Genitourinary: Negative.   Musculoskeletal: Negative.   Skin:  Positive for wound. Negative for rash.  Neurological:  Negative for headaches.  All other systems reviewed and are negative.  Physical Exam Updated Vital Signs BP (!) 145/85 (BP Location: Right Arm)   Pulse 70   Temp 98 F (36.7 C)   Resp 16   SpO2 98%   Physical Exam Vitals and nursing note reviewed.  Constitutional:      General: He is not in acute distress.    Appearance: He is not ill-appearing.  HENT:     Head: Atraumatic.  Eyes:  Conjunctiva/sclera: Conjunctivae normal.  Cardiovascular:     Rate and Rhythm: Normal rate and regular rhythm.     Pulses: Normal pulses.     Heart sounds: No murmur heard. Pulmonary:     Effort: Pulmonary effort is normal. No respiratory distress.     Breath sounds: Normal breath sounds.  Abdominal:     General: Abdomen is flat. There is no distension.     Palpations: Abdomen is soft.     Tenderness: There is no abdominal tenderness.  Musculoskeletal:        General: Normal range of motion.     Right hand: Laceration present.     Cervical back: Normal range of motion.     Comments: Avulsion of the tip of the right pinky finger.  3 cm laceration spiraling down to the mid pinky.  Wound is very well clotted, no visible bones  Skin:    General: Skin is warm and dry.     Capillary Refill: Capillary refill takes less than 2 seconds.  Neurological:     General: No focal deficit present.     Mental Status: He is alert.  Psychiatric:        Mood and Affect: Mood normal.    ED  Results / Procedures / Treatments   Labs (all labs ordered are listed, but only abnormal results are displayed) Labs Reviewed - No data to display  EKG None  Radiology DG Hand Complete Left  Result Date: 01/08/2021 CLINICAL DATA:  Laceration. EXAM: LEFT HAND - COMPLETE 3+ VIEW COMPARISON:  None. FINDINGS: There is a mildly displaced transverse fracture at the base of the distal phalanx of the fifth finger. No evidence of dislocation. There is a soft tissue defect in the distal fifth digit. IMPRESSION: Mildly displaced transverse fracture at the base of the distal phalanx of the fifth finger. Soft tissue defect in the distal fifth digit. Electronically Signed   By: Audie Pinto M.D.   On: 01/08/2021 13:57    Procedures .Nail Removal  Date/Time: 01/08/2021 6:32 PM Performed by: Tonye Pearson, PA-C Authorized by: Tonye Pearson, PA-C   Consent:    Consent obtained:  Verbal   Consent given by:  Patient   Risks, benefits, and alternatives were discussed: yes     Risks discussed:  Bleeding and permanent nail deformity   Alternatives discussed:  No treatment Universal protocol:    Procedure explained and questions answered to patient or proxy's satisfaction: yes     Test results available: yes     Imaging studies available: yes     Immediately prior to procedure, a time out was called: yes     Patient identity confirmed:  Verbally with patient Location:    Hand:  L small finger Pre-procedure details:    Skin preparation:  Povidone-iodine Anesthesia:    Anesthesia method:  Nerve block   Block needle gauge:  25 G   Block anesthetic:  Lidocaine 1% w/o epi   Block injection procedure:  Anatomic landmarks identified, introduced needle and negative aspiration for blood Nail Removal:    Nail removed:  Complete   Removed nail replaced and anchored: yes   Trephination:    Subungual hematoma drained: no   Ingrown nail:    Wedge excision of skin: no   Post-procedure details:     Dressing:  Xeroform gauze, gauze roll and splint   Procedure completion:  Tolerated .Marland KitchenLaceration Repair  Date/Time: 01/08/2021 6:34 PM Performed by: Tonye Pearson, PA-C Authorized  by: Tonye Pearson, PA-C   Consent:    Consent obtained:  Verbal   Consent given by:  Patient   Risks discussed:  Infection, need for additional repair, pain, poor cosmetic result and poor wound healing   Alternatives discussed:  No treatment and delayed treatment Universal protocol:    Procedure explained and questions answered to patient or proxy's satisfaction: yes     Relevant documents present and verified: yes     Test results available: yes     Imaging studies available: yes     Immediately prior to procedure, a time out was called: yes     Patient identity confirmed:  Verbally with patient Anesthesia:    Anesthesia method:  Nerve block   Block anesthetic:  Lidocaine 1% w/o epi   Block injection procedure:  Introduced needle, anatomic landmarks identified and negative aspiration for blood   Block outcome:  Anesthesia achieved Laceration details:    Location:  Finger   Finger location:  L small finger   Length (cm):  7   Depth (mm):  3 Treatment:    Area cleansed with:  Povidone-iodine and saline   Amount of cleaning:  Extensive   Irrigation solution:  Sterile saline   Irrigation volume:  1032ml   Irrigation method:  Pressure wash   Visualized foreign bodies/material removed: no     Debridement:  None   Scar revision: no   Skin repair:    Repair method:  Sutures   Suture size:  6-0   Suture material:  Prolene   Number of sutures:  22 Approximation:    Approximation:  Close Repair type:    Repair type:  Intermediate Post-procedure details:    Dressing:  Splint for protection and non-adherent dressing   Procedure completion:  Tolerated   Medications Ordered in ED Medications  Tdap (BOOSTRIX) injection 0.5 mL (0.5 mLs Intramuscular Given 01/08/21 1526)    ED Course  I  have reviewed the triage vital signs and the nursing notes.  Pertinent labs & imaging results that were available during my care of the patient were reviewed by me and considered in my medical decision making (see chart for details).  Clinical Course as of 01/08/21 1534  Sun Jan 08, 2021  1532 DG Hand Complete Left [MK]    Clinical Course User Index [MK] Kommor, Debe Coder, MD   MDM Rules/Calculators/A&P                         65 year old male here for evaluation of an avulsion laceration of the right pinky finger.  X-ray positive for fractures at the base of the distal phalanx.  Fingertip is avulsed with jagged laceration approximately 6 cm long by lying down just below the second knuckle.  No visible tendons or bones.  Sensation is intact.  Range of motion intact.   Consult with Dr. Doran Durand in orthopedics who advised extensive cleaning and irrigation, nail removal and laceration repair with outpatient follow-up.  Laceration repair as described above.  Splint was placed. aftercare instructions given.  Outpatient referral with [facility] placed.  Given patient's open wound and fracture, sent home with 10-day course of cephalexin.  Final Clinical Impression(s) / ED Diagnoses Final diagnoses:  Complicated laceration of finger, initial encounter  Open displaced fracture of distal phalanx of left little finger, initial encounter    Rx / DC Orders ED Discharge Orders          Ordered  cephALEXin (KEFLEX) 500 MG capsule  4 times daily        01/08/21 1807    cephALEXin (KEFLEX) 500 MG capsule  4 times daily        01/08/21 1821             Rodena Piety 01/08/21 1849    Teressa Lower, MD 01/08/21 2350

## 2021-01-08 NOTE — ED Provider Notes (Signed)
Emergency Medicine Provider Triage Evaluation Note  Ronnie [name] , a 65 y.o. male  was evaluated in triage.  Patient states about 20 minutes ago he sustained a laceration to his left pinky finger from a vacuum cleaner fan.  He denies any numbness to his left pinky finger.    Review of Systems  Positive: Laceration Negative: Numbness  Physical Exam  BP 118/71 (BP Location: Right Arm)   Pulse (!) 55   Temp (!) 97.5 F (36.4 C)   Resp (!) 22   SpO2 96%  Gen:   Awake, no distress   Resp:  Normal effort  MSK:   Moves extremities without difficulty  Other:  2 cm laceration to the tip of left fifth metatarsal with nailbed involvement.  Cannot visualize bone.  Sensation intact.  Good blood flow to your tip  Medical Decision Making  Medically screening exam initiated at 1:16 PM.  Appropriate orders placed.  Ronnie Bien Dungee was informed that the remainder of the evaluation will be completed by another provider, this initial triage assessment does not replace that evaluation, and the importance of remaining in the ED until their evaluation is complete.     Adolphus Birchwood, PA-C 01/08/21 1318    Pattricia Boss, MD 01/08/21 [phone]

## 2021-01-08 NOTE — ED Provider Notes (Signed)
Pt seen in conjunction with E Conklin, PA-C. Please see her notes for full history, physical, and plan.   In brief, pt presenting for evaluation of the L pinky finger. Injured in a vacuum fan. Not having significant pain. Tdap updated in the ED. No injury elsewhere. Xray shows distal phalanx fx. Pt has distal finger avulsion and laceration of radial distal finger extending from the tip over the DIP and PIP. Discussed with Dr. Doran Durand, on call for hand, who recommends repair in the ED and OP follow up.    Franchot Heidelberg, PA-C 01/08/21 1801    Teressa Lower, MD 01/08/21 [phone]

## 2021-02-06 LAB — HM COLONOSCOPY

## 2021-11-27 IMAGING — CR DG HAND COMPLETE 3+V*L*
3 series · 3 of 3 positions shown · non-contrast
Comparison: None.

CLINICAL DATA: Laceration.

EXAM:
LEFT HAND - COMPLETE 3+ VIEW

[hand pa]
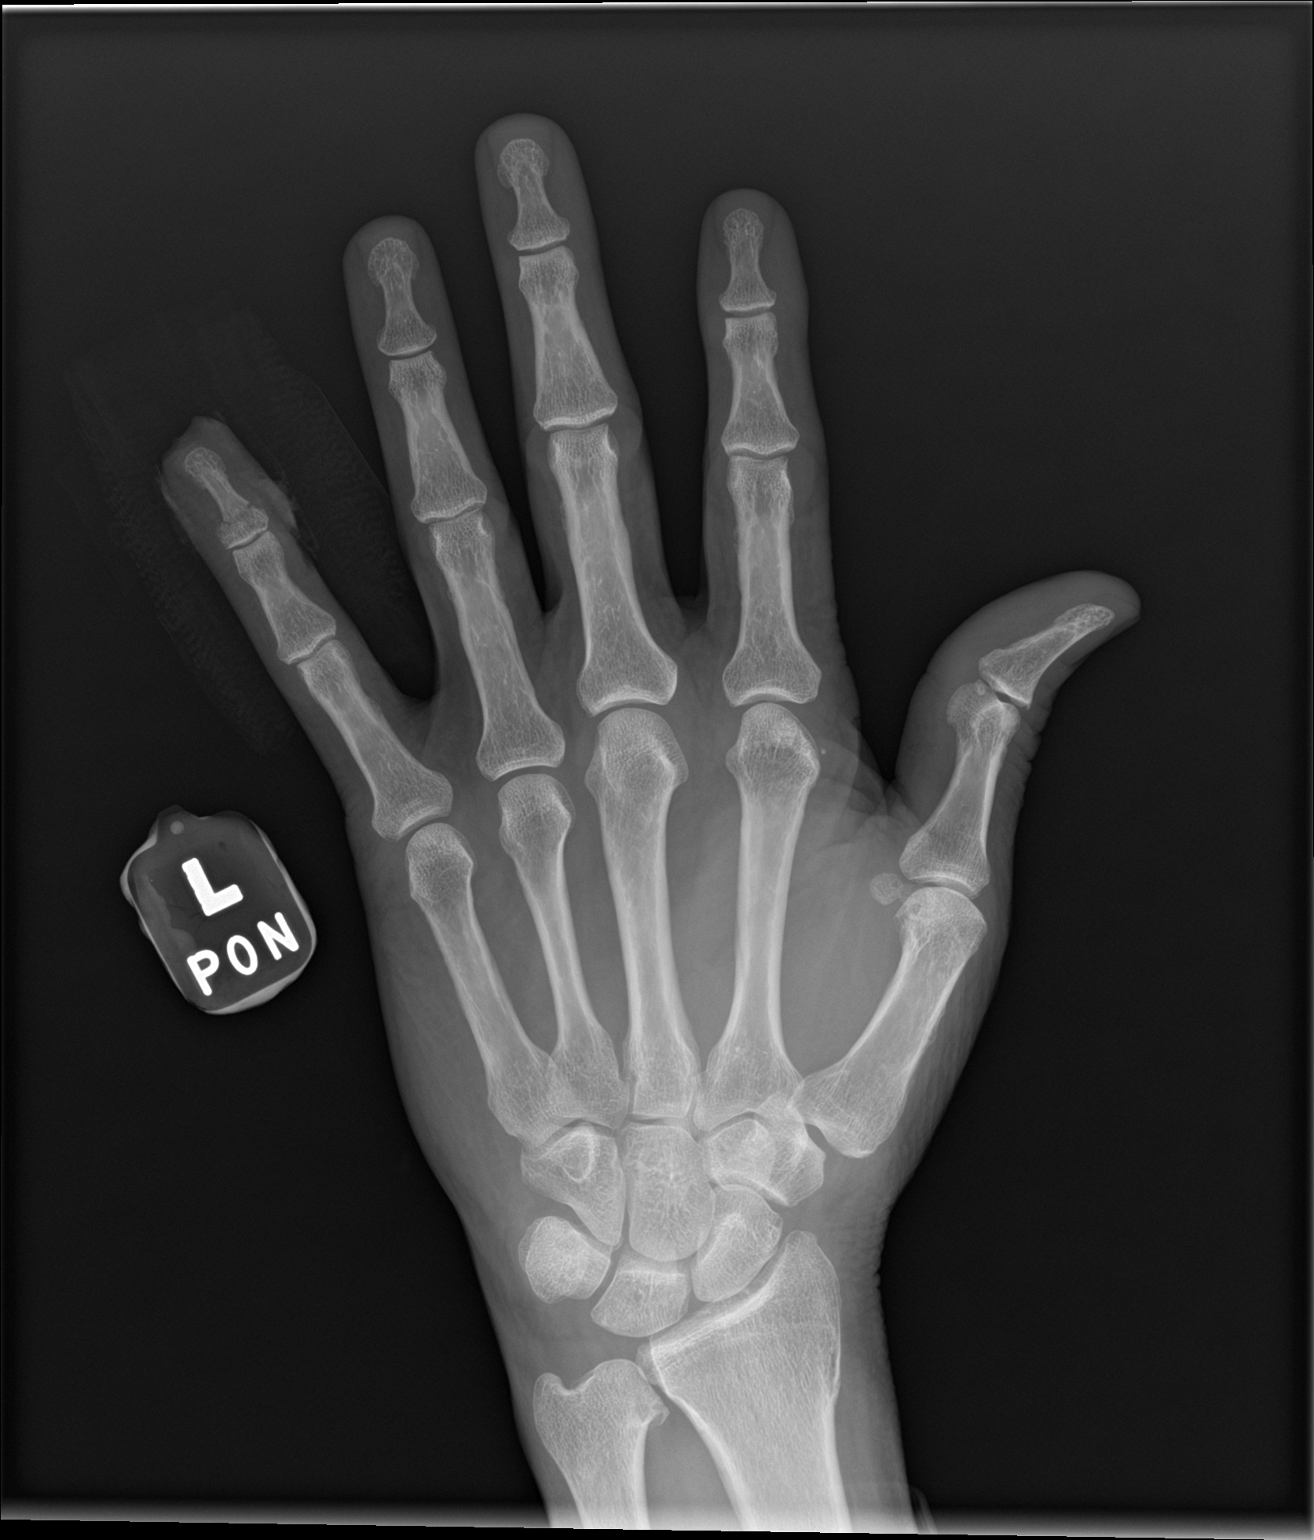

[hand obl]
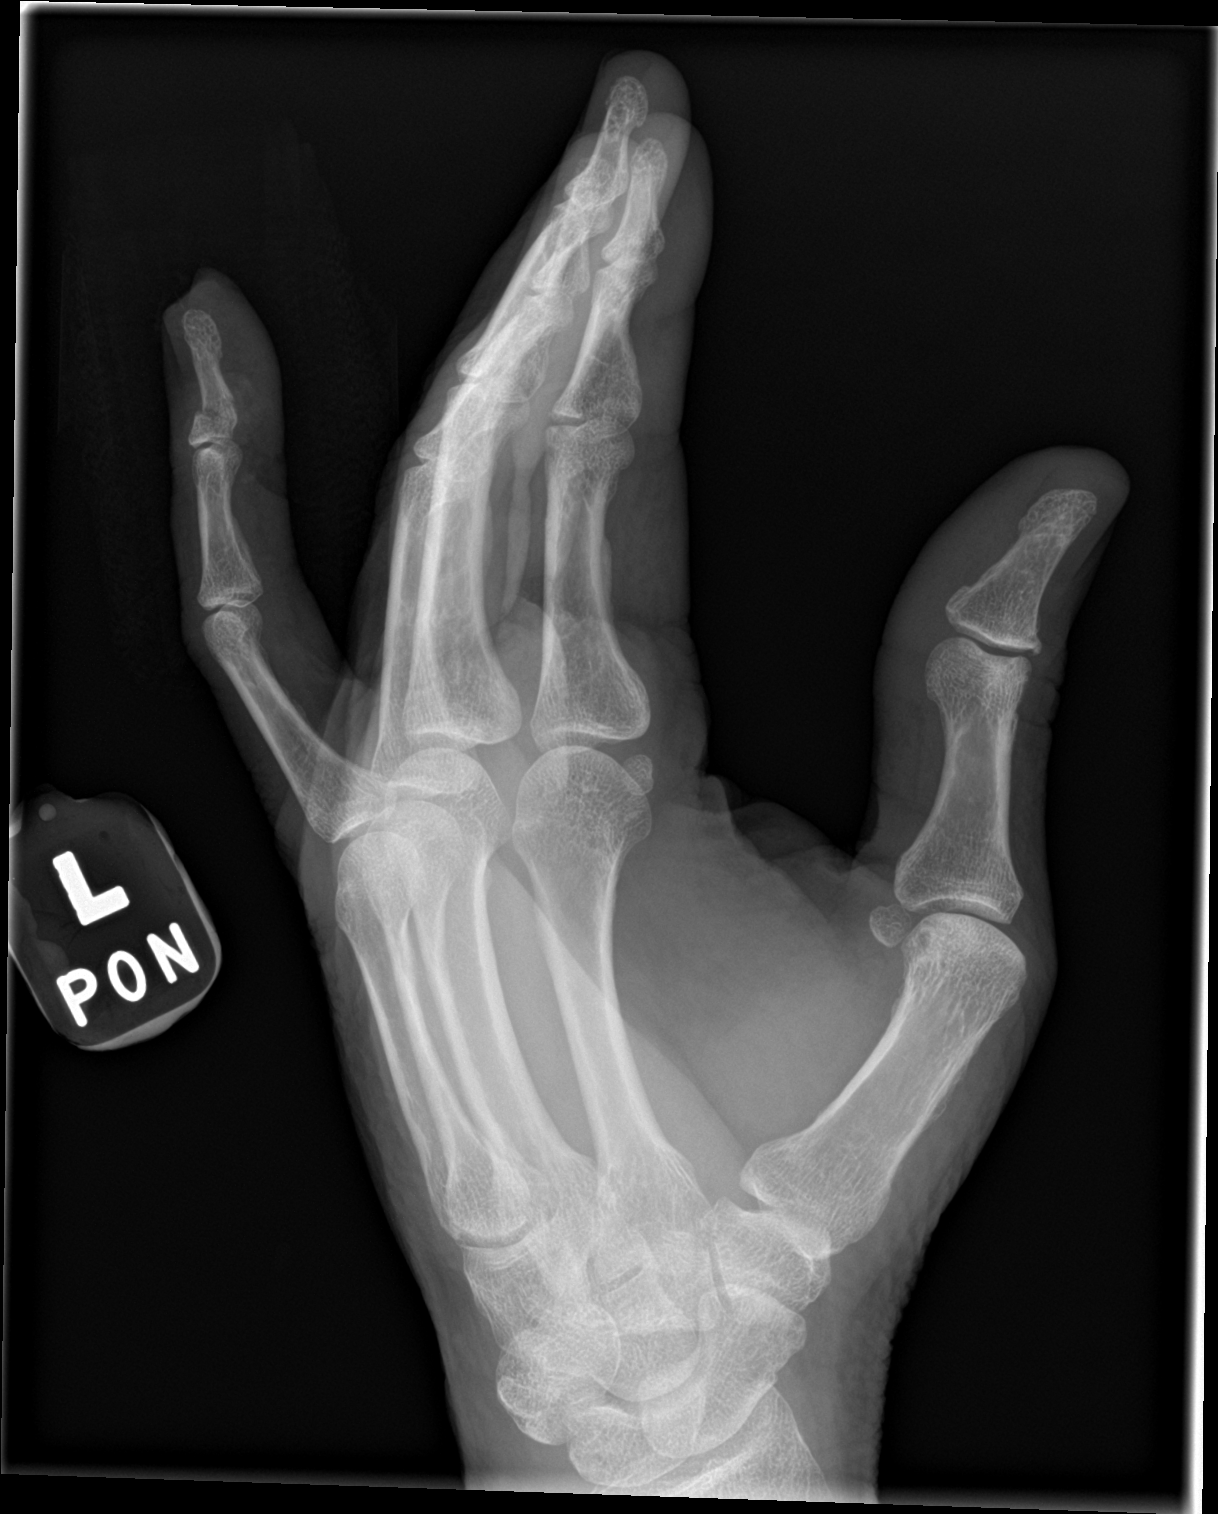

[hand lat]
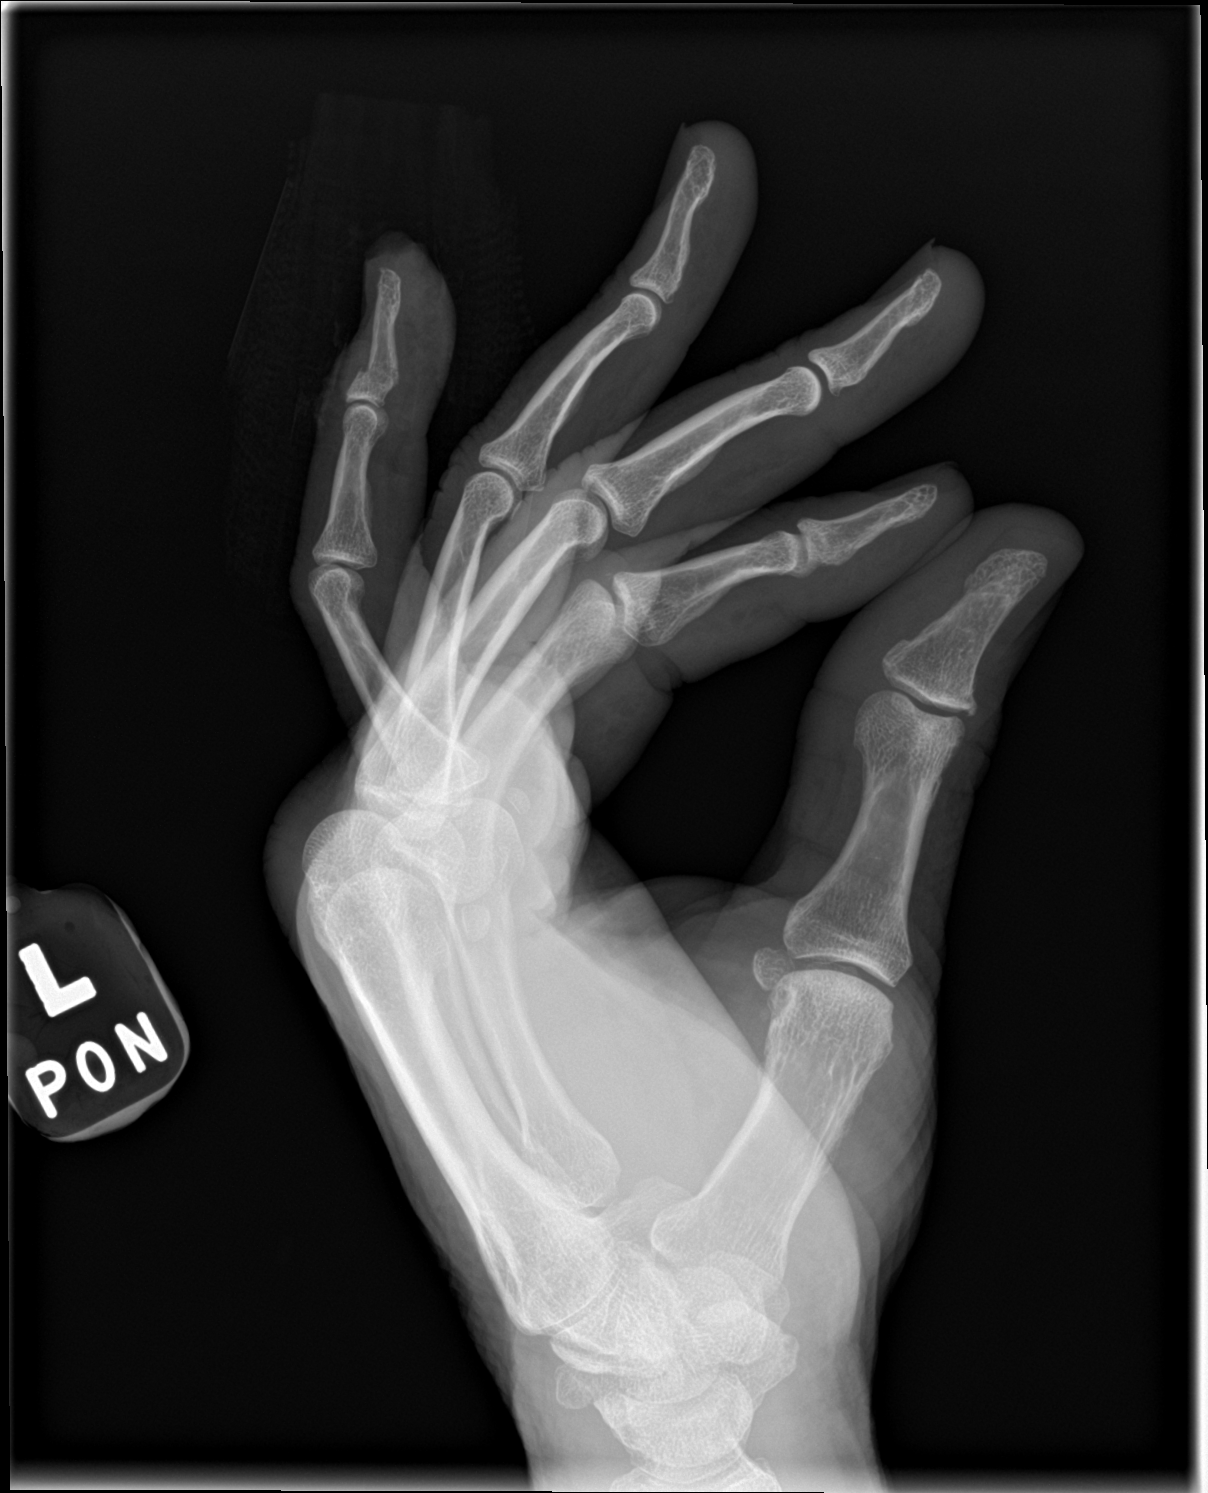

[3 of 3 positions shown; findings below may reference images not displayed]

FINDINGS: There is a mildly displaced transverse fracture at the base of the
distal phalanx of the fifth finger. No evidence of dislocation.
There is a soft tissue defect in the distal fifth digit.
IMPRESSION: Mildly displaced transverse fracture at the base of the distal
phalanx of the fifth finger. Soft tissue defect in the distal fifth
digit.

## 2022-11-19 ENCOUNTER — Telehealth: Payer: Self-pay | Admitting: Internal Medicine

## 2022-11-19 NOTE — Telephone Encounter (Signed)
Noted I will check him on 10/2 It doesn't seem to be a problem waiting till then

## 2022-11-19 NOTE — Telephone Encounter (Signed)
FYI: This call has been transferred to Access Nurse. Once the result note has been entered staff can address the message at that time.  Patient called in with the following symptoms:  Red Word:dizziness    Please advise at Mobile [phone] (mobile)  Message is routed to Provider Pool and [facility] Triage   Pt's wife, Raynelle Fanning, called stating the pt has been feeling dizzy lately & requested an appt. No other symptoms. Raynelle Fanning wasn't around pt to triage. Scheduled an appt for Wed, 10/2 with Dr. Alphonsus Sias. Pt's call back # [phone]

## 2022-11-19 NOTE — Telephone Encounter (Signed)
I spoke with pt; pt said has lightheadedness on and off for "awhile". No H/A, CP or SOB. Pt is not lightheaded today. Pt has not taken BP in long time and I offered pt appt for today but pt said 11/21/22 would work best for him. Pt already scheduled for 11/21/22 at 10:45 with Dr Alphonsus Sias with UC & ED precautions and pt voiced understanding. Pt will ck BP next couple of days and will bring readings to appt. Sending note to Dr Alphonsus Sias and Alphonsus Sias pool.

## 2022-11-21 ENCOUNTER — Encounter: Payer: Self-pay | Admitting: Internal Medicine

## 2022-11-21 ENCOUNTER — Ambulatory Visit (INDEPENDENT_AMBULATORY_CARE_PROVIDER_SITE_OTHER): Payer: Self-pay | Admitting: Internal Medicine

## 2022-11-21 VITALS — BP 112/78 | HR 60 | Temp 98.3°F | Ht 71.0 in | Wt 201.0 lb

## 2022-11-21 DIAGNOSIS — R42 Dizziness and giddiness: Secondary | ICD-10-CM

## 2022-11-21 DIAGNOSIS — R61 Generalized hyperhidrosis: Secondary | ICD-10-CM | POA: Diagnosis not present

## 2022-11-21 LAB — COMPREHENSIVE METABOLIC PANEL
ALT: 13 U/L (ref 0–53)
AST: 18 U/L (ref 0–37)
Albumin: 4.3 g/dL (ref 3.5–5.2)
Alkaline Phosphatase: 100 U/L (ref 39–117)
BUN: 14 mg/dL (ref 6–23)
CO2: 26 meq/L (ref 19–32)
Calcium: 9.2 mg/dL (ref 8.4–10.5)
Chloride: 106 meq/L (ref 96–112)
Creatinine, Ser: 0.97 mg/dL (ref 0.40–1.50)
GFR: 81.29 mL/min (ref >60.00)
Glucose, Bld: 90 mg/dL (ref 70–99)
Potassium: 4.1 meq/L (ref 3.5–5.1)
Sodium: 139 meq/L (ref 135–145)
Total Bilirubin: 1.1 mg/dL (ref 0.2–1.2)
Total Protein: 6.7 g/dL (ref 6.0–8.3)

## 2022-11-21 LAB — CBC
HCT: 46.8 % (ref 39.0–52.0)
Hemoglobin: 15.8 g/dL (ref 13.0–17.0)
MCHC: 33.7 g/dL (ref 30.0–36.0)
MCV: 87.1 fL (ref 78.0–100.0)
Platelets: 253 10*3/uL (ref 150.0–400.0)
RBC: 5.37 Mil/uL (ref 4.22–5.81)
RDW: 13.3 % (ref 11.5–15.5)
WBC: 6.8 10*3/uL (ref 4.0–10.5)

## 2022-11-21 LAB — TSH: TSH: 1.08 u[IU]/mL (ref 0.35–5.50)

## 2022-11-21 LAB — SEDIMENTATION RATE: Sed Rate: 9 mm/h (ref 0–20)

## 2022-11-21 LAB — VITAMIN B12: Vitamin B-12: 410 pg/mL (ref 211–911)

## 2022-11-21 MED ORDER — MECLIZINE HCL 25 MG PO TABS: 25.0000 mg | ORAL_TABLET | Freq: Three times a day (TID) | ORAL | 0 refills | Status: DC | PRN

## 2022-11-21 NOTE — Assessment & Plan Note (Signed)
Could be related to the vertigo He wondered about low sugars---but not likely Will check labs--especially thyroid--to see if any other cause

## 2022-11-21 NOTE — Progress Notes (Signed)
Subjective:    Patient ID: Ronnie [name], male    DOB: 1955-11-03, 67 y.o.   MRN: [id number]  HPI Here due to dizziness--goes back 2 weeks or so  Wakes up at night--and notices being dizzy When he rolls over---head keep spinning Needs to take his time to keep balance Does have true spinning if he turns head  Will feel it in the morning--and continues till 3PM Notices more sweating than is typical for him  Doesn't really feel presyncopal No chest pain or SOB  Tried cutting back on evening eating and sugared drinks May have helped some  No current outpatient medications on file prior to visit.   No current facility-administered medications on file prior to visit.    No Known Allergies  Past Medical History:  Diagnosis Date   Back pain 2009   with radiculopathy. Fell off roof bulging L4/L5 and a comp fx L5/S1   Ear lump    Elevated liver function tests    Enlarged prostate    Hepatic cyst    Jaundice    Numbness and tingling of foot    Right   Numerous moles    Has always had multiple nevi   Renal cyst    Splenomegaly     Past Surgical History:  Procedure Laterality Date   BACK SURGERY     Bulging disc L4/L5, comp fx L5/S1  2009   Post falling off roof   COLONOSCOPY  2017   [facility]     Family History  Problem Relation Age of Onset   Cancer Mother        Liver   Heart disease Father        rheumatic heart disease, no CAD   Heart disease Paternal Uncle    Heart disease Paternal Grandfather    Colon cancer Neg Hx    Pancreatic cancer Neg Hx    Esophageal cancer Neg Hx    Liver disease Neg Hx    Liver cancer Neg Hx     Social History   Socioeconomic History   Marital status: Married    Spouse name: Not on file   Number of children: 2   Years of education: Not on file   Highest education level: Not on file  Occupational History   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   Social Determinants of Health   Financial Resource Strain: Not on file  Food Insecurity: Not on file  Transportation Needs: Not on file  Physical Activity: Not on file  Stress: Not on file  Social Connections: Not on file  Intimate Partner Violence: Not on file   Review of Systems No hearing loss No tinnitus in at least a year No headaches No focal neurologic symptoms No facial droop or aphasia No recent travel    Objective:   Physical Exam Constitutional:      Appearance: Normal appearance.  HENT:     Mouth/Throat:     Pharynx: No oropharyngeal exudate or posterior oropharyngeal erythema.  Eyes:     Extraocular Movements: Extraocular movements intact.     Pupils: Pupils are equal, round, and reactive  to light.     Comments: No nystagmus  Cardiovascular:     Rate and Rhythm: Normal rate and regular rhythm.     Heart sounds: No murmur heard.    No gallop.  Musculoskeletal:     Cervical back: Neck supple.     Right lower leg: No edema.     Left lower leg: No edema.  Lymphadenopathy:     Cervical: No cervical adenopathy.  Neurological:     Mental Status: He is alert and oriented to person, place, and time.     Cranial Nerves: Cranial nerves 2-12 are intact.     Sensory: Sensation is intact.     Motor: No weakness, tremor, atrophy or abnormal muscle tone.     Coordination: Romberg sign negative.     Gait: Gait normal.  Psychiatric:        Mood and Affect: Mood normal.        Behavior: Behavior normal.            Assessment & Plan:

## 2022-11-21 NOTE — Assessment & Plan Note (Signed)
Symptoms sound vestibular No neuro symptoms/signs to suggest CVA or mass lesion--reassured No symptoms of Meniere's Will try meclizine 25 tid for now then wean (and does have motion sickness)

## 2024-01-24 ENCOUNTER — Telehealth: Payer: Self-pay | Admitting: Internal Medicine

## 2024-01-24 NOTE — Telephone Encounter (Signed)
 Copied from CRM [id number]. Topic: Appointments - Transfer of Care >> Jan 24, 2024  9:43 AM Alfonso ORN wrote: Pt is requesting to transfer FROM: Ronnie [name] Pt is requesting to transfer TO: [name] Reason for requested transfer: Pt is requesting to be seen by [name] as his wife is already a current patient . Former pcp no longer active. Please advise  Would it be okay?

## 2024-01-24 NOTE — Telephone Encounter (Signed)
 Sure. Thanks

## 2024-03-03 ENCOUNTER — Encounter: Payer: Self-pay | Admitting: Family Medicine

## 2024-03-03 ENCOUNTER — Ambulatory Visit: Admitting: Family Medicine

## 2024-03-03 VITALS — BP 138/78 | HR 67 | Temp 98.5°F | Ht 71.0 in | Wt 207.2 lb

## 2024-03-03 DIAGNOSIS — Z8619 Personal history of other infectious and parasitic diseases: Secondary | ICD-10-CM

## 2024-03-03 DIAGNOSIS — R972 Elevated prostate specific antigen [PSA]: Secondary | ICD-10-CM | POA: Diagnosis not present

## 2024-03-03 DIAGNOSIS — Z125 Encounter for screening for malignant neoplasm of prostate: Secondary | ICD-10-CM

## 2024-03-03 DIAGNOSIS — Z7189 Other specified counseling: Secondary | ICD-10-CM | POA: Diagnosis not present

## 2024-03-03 DIAGNOSIS — R220 Localized swelling, mass and lump, head: Secondary | ICD-10-CM | POA: Diagnosis not present

## 2024-03-03 DIAGNOSIS — H539 Unspecified visual disturbance: Secondary | ICD-10-CM | POA: Diagnosis not present

## 2024-03-03 DIAGNOSIS — Z8639 Personal history of other endocrine, nutritional and metabolic disease: Secondary | ICD-10-CM | POA: Diagnosis not present

## 2024-03-03 LAB — CBC WITH DIFFERENTIAL/PLATELET
Basophils Absolute: 0.1 K/uL (ref 0.0–0.1)
Basophils Relative: 0.8 % (ref 0.0–3.0)
Eosinophils Absolute: 0.2 K/uL (ref 0.0–0.7)
Eosinophils Relative: 2 % (ref 0.0–5.0)
HCT: 48.3 % (ref 39.0–52.0)
Hemoglobin: 16.6 g/dL (ref 13.0–17.0)
Lymphocytes Relative: 18.4 % (ref 12.0–46.0)
Lymphs Abs: 1.5 K/uL (ref 0.7–4.0)
MCHC: 34.4 g/dL (ref 30.0–36.0)
MCV: 85.7 fl (ref 78.0–100.0)
Monocytes Absolute: 0.7 K/uL (ref 0.1–1.0)
Monocytes Relative: 8.2 % (ref 3.0–12.0)
Neutro Abs: 5.8 K/uL (ref 1.4–7.7)
Neutrophils Relative %: 70.6 % (ref 43.0–77.0)
Platelets: 253 K/uL (ref 150.0–400.0)
RBC: 5.64 Mil/uL (ref 4.22–5.81)
RDW: 13.4 % (ref 11.5–15.5)
WBC: 8.1 K/uL (ref 4.0–10.5)

## 2024-03-03 LAB — COMPREHENSIVE METABOLIC PANEL WITH GFR
ALT: 14 U/L (ref 3–53)
AST: 17 U/L (ref 5–37)
Albumin: 4.4 g/dL (ref 3.5–5.2)
Alkaline Phosphatase: 105 U/L (ref 39–117)
BUN: 14 mg/dL (ref 6–23)
CO2: 26 meq/L (ref 19–32)
Calcium: 9.1 mg/dL (ref 8.4–10.5)
Chloride: 102 meq/L (ref 96–112)
Creatinine, Ser: 0.91 mg/dL (ref 0.40–1.50)
GFR: 86.98 mL/min
Glucose, Bld: 84 mg/dL (ref 70–99)
Potassium: 4.2 meq/L (ref 3.5–5.1)
Sodium: 138 meq/L (ref 135–145)
Total Bilirubin: 0.9 mg/dL (ref 0.2–1.2)
Total Protein: 7.2 g/dL (ref 6.0–8.3)

## 2024-03-03 LAB — PSA, MEDICARE: PSA: 5.07 ng/mL — ABNORMAL HIGH (ref 0.10–4.00)

## 2024-03-03 LAB — HEMOGLOBIN A1C: Hgb A1c MFr Bld: 5.4 % (ref 4.6–6.5)

## 2024-03-03 MED ORDER — EPINEPHRINE 0.3 MG/0.3ML IJ SOAJ: 0.3000 mg | INTRAMUSCULAR | 1 refills | Status: AC | PRN

## 2024-03-03 NOTE — Progress Notes (Unsigned)
 I am seeing the patient for the first time today.    H/o elevated PSA.  D/w pt about recheck today.  No burning with urination.  Recheck PSA pending.   He still has occ L leg radicular pain.  He is managing as is.   H/o hepatitis.  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  Discussed previous workup.  No jaundice or abdominal pain now.  He still has episodic lip/hand/foot swelling.  No clear trigger identified.  Benadryl helps with lip swelling.    Wife designated if patient were incapacitated.    He has occ aura in the periphery of vision, over the last 6-8 years, a few times a year.  No clear dx of migraine previously per patient report.  H/o elevated A1c but not DM2.    Meds, vitals, and allergies reviewed.   ROS: Per HPI unless specifically indicated in ROS section   GEN: nad, alert and oriented HEENT: mucous membranes moist NECK: supple w/o LA CV: rrr PULM: ctab, no inc wob ABD: soft, +bs EXT: no edema SKIN: no acute rash Not jaundiced.  43 minutes were devoted to patient care in this encounter (this includes time spent reviewing the patient's file/history, interviewing and examining the patient, counseling/reviewing plan with patient).

## 2024-03-03 NOTE — Patient Instructions (Addendum)
 Go to the lab on the way out.   If you have mychart we'll likely use that to update you.    Take care.  Glad to see you. Refer to eye clinic. Refer to allergy clinic.  Let me know if you can't get an appointment.

## 2024-03-04 ENCOUNTER — Ambulatory Visit: Payer: Self-pay | Admitting: Family Medicine

## 2024-03-04 DIAGNOSIS — H539 Unspecified visual disturbance: Secondary | ICD-10-CM | POA: Insufficient documentation

## 2024-03-04 DIAGNOSIS — R972 Elevated prostate specific antigen [PSA]: Secondary | ICD-10-CM

## 2024-03-04 DIAGNOSIS — Z7189 Other specified counseling: Secondary | ICD-10-CM | POA: Insufficient documentation

## 2024-03-04 DIAGNOSIS — R220 Localized swelling, mass and lump, head: Secondary | ICD-10-CM | POA: Insufficient documentation

## 2024-03-04 NOTE — Assessment & Plan Note (Signed)
 See notes on labs.

## 2024-03-04 NOTE — Assessment & Plan Note (Signed)
 He could have aura without migraine.  I asked him to check with the eye clinic first.

## 2024-03-04 NOTE — Assessment & Plan Note (Signed)
 History of, unclear source.  EpiPen  cautions given with routine instructions.  Refer to allergy clinic.

## 2024-03-04 NOTE — Assessment & Plan Note (Signed)
 H/o hepatitis.  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  Discussed previous workup.  No jaundice or abdominal pain now. Defer extra workup for now along with hep A and hep B vaccine until other issues are addressed

## 2024-03-04 NOTE — Assessment & Plan Note (Signed)
 Wife designated if patient were incapacitated.

## 2024-04-08 ENCOUNTER — Ambulatory Visit: Payer: Self-pay | Admitting: Allergy
